# Patient Record
Sex: Male | Born: 1955 | Race: Black or African American | Hispanic: No | Marital: Single | Smoking: Current every day smoker
Health system: Southern US, Community
[De-identification: ages and names within clinical notes are randomized; demographics above are authoritative.]

---

## 2002-07-12 ENCOUNTER — Emergency Department (HOSPITAL_COMMUNITY): Admission: EM | Admit: 2002-07-12 | Discharge: 2002-07-12 | Payer: Self-pay | Admitting: Emergency Medicine

## 2004-05-31 ENCOUNTER — Emergency Department (HOSPITAL_COMMUNITY): Admission: EM | Admit: 2004-05-31 | Discharge: 2004-05-31 | Payer: Self-pay | Admitting: Emergency Medicine

## 2004-11-16 ENCOUNTER — Emergency Department (HOSPITAL_COMMUNITY): Admission: EM | Admit: 2004-11-16 | Discharge: 2004-11-16 | Payer: Self-pay | Admitting: Emergency Medicine

## 2004-12-03 ENCOUNTER — Emergency Department (HOSPITAL_COMMUNITY): Admission: EM | Admit: 2004-12-03 | Discharge: 2004-12-03 | Payer: Self-pay | Admitting: Emergency Medicine

## 2004-12-06 ENCOUNTER — Inpatient Hospital Stay (HOSPITAL_COMMUNITY): Admission: EM | Admit: 2004-12-06 | Discharge: 2004-12-08 | Payer: Self-pay | Admitting: Emergency Medicine

## 2004-12-09 ENCOUNTER — Emergency Department (HOSPITAL_COMMUNITY): Admission: EM | Admit: 2004-12-09 | Discharge: 2004-12-09 | Payer: Self-pay | Admitting: Emergency Medicine

## 2004-12-09 ENCOUNTER — Encounter (HOSPITAL_COMMUNITY): Admission: RE | Admit: 2004-12-09 | Discharge: 2005-03-09 | Payer: Self-pay | Admitting: Orthopaedic Surgery

## 2004-12-09 ENCOUNTER — Ambulatory Visit: Payer: Self-pay | Admitting: Family Medicine

## 2006-10-02 ENCOUNTER — Emergency Department (HOSPITAL_COMMUNITY): Admission: EM | Admit: 2006-10-02 | Discharge: 2006-10-02 | Payer: Self-pay | Admitting: Family Medicine

## 2007-04-10 ENCOUNTER — Emergency Department (HOSPITAL_COMMUNITY): Admission: EM | Admit: 2007-04-10 | Discharge: 2007-04-10 | Payer: Self-pay | Admitting: Emergency Medicine

## 2010-07-24 NOTE — Discharge Summary (Signed)
NAME:  David Young, David Young NO.:  [id number]   MEDICAL RECORD NO.:  [id number]          PATIENT TYPE:  INP   LOCATION:  1616                         FACILITY:  [facility]   PHYSICIAN:  Mark C. Ophelia Charter, M.D.    DATE OF BIRTH:  09/11/1955   DATE OF ADMISSION:  12/06/2004  DATE OF DISCHARGE:  12/08/2004                                 DISCHARGE SUMMARY   FINAL DIAGNOSES:  Right long finger dorsal laceration with abscess  formation.   A 55 year old male had a right finger dorsal laceration closed in the ER two  weeks ago.  Doing well until 48 hours prior to this presentation with pain,  edema, and fever.  No previous history of hospitalizations or surgery.  He  works at [employer].   ALLERGIES:  NKDA.   REVIEW OF SYSTEMS:  __neg.CV.Resp. Gi, Endo.________.   Temperature was 100.4.   ADMISSION LABS:  Routine admission labs included white count 12,600,  hemoglobin 14.8.  Chemistry panel was normal.  Glycosylated hemoglobin was  5.8 with a serum glucose of 95.  HIV was nonreactive.  C-reactive protein  was 2.3.  Vancomycin trough was obtained, which is 5.4 following the  hospitalization, and cultures obtained in the operating room showed moderate  Staphylococcus aureus sensitive to oxacillin.   HOSPITAL COURSE:  The patient was admitted, taken to the operating room, and  underwent irrigation and debridement of purulent material from his finger.  Cultures were obtained.  He was started on vancomycin and Cleocin.  Daily  dressing changes with pulsatile lavage, arm elevation.  He is able to  shower, placed on skin precautions.  Social Services care management  arrangements were made for doxycycline 100 mg p.o. b.i.d.  and to get his  prescription filled.  After his last dose of vancomycin, he is discharged on  October 3 and with culture results, he was switched to Keflex 500 mg p.o.  b.i.d. with outpatient hydrotherapy.  Also followup in one week will be in  my  office.  Range of motion of the finger is significantly improved.      Mark C. Ophelia Charter, M.D.  Electronically Signed     MCY/MEDQ  D:  01/08/2005  T:  01/08/2005  Job:  [id number]

## 2012-11-27 ENCOUNTER — Emergency Department (HOSPITAL_COMMUNITY): Payer: Self-pay

## 2012-11-27 ENCOUNTER — Encounter (HOSPITAL_COMMUNITY): Payer: Self-pay

## 2012-11-27 ENCOUNTER — Emergency Department (HOSPITAL_COMMUNITY)
Admission: EM | Admit: 2012-11-27 | Discharge: 2012-11-27 | Disposition: A | Discharge status: Home / Self Care | Payer: Self-pay | Attending: Emergency Medicine | Admitting: Emergency Medicine

## 2012-11-27 DIAGNOSIS — T07XXXA Unspecified multiple injuries, initial encounter: Secondary | ICD-10-CM

## 2012-11-27 DIAGNOSIS — F17200 Nicotine dependence, unspecified, uncomplicated: Secondary | ICD-10-CM | POA: Insufficient documentation

## 2012-11-27 DIAGNOSIS — Y92410 Unspecified street and highway as the place of occurrence of the external cause: Secondary | ICD-10-CM | POA: Insufficient documentation

## 2012-11-27 DIAGNOSIS — S93409A Sprain of unspecified ligament of unspecified ankle, initial encounter: Secondary | ICD-10-CM | POA: Insufficient documentation

## 2012-11-27 DIAGNOSIS — Y9355 Activity, bike riding: Secondary | ICD-10-CM | POA: Insufficient documentation

## 2012-11-27 DIAGNOSIS — IMO0002 Reserved for concepts with insufficient information to code with codable children: Secondary | ICD-10-CM | POA: Insufficient documentation

## 2012-11-27 MED ORDER — IBUPROFEN 800 MG PO TABS: 800.0000 mg | ORAL_TABLET | Freq: Three times a day (TID) | ORAL | Status: DC | PRN

## 2012-11-27 MED ORDER — HYDROCODONE-ACETAMINOPHEN 5-325 MG PO TABS
1.0000 | ORAL_TABLET | Freq: Once | ORAL | Status: AC
  Administered 2012-11-27: 1 via ORAL
  Filled 2012-11-27: qty 1

## 2012-11-27 MED ORDER — TETANUS-DIPHTH-ACELL PERTUSSIS 5-2.5-18.5 LF-MCG/0.5 IM SUSP
0.5000 mL | Freq: Once | INTRAMUSCULAR | Status: AC
  Administered 2012-11-27: 0.5 mL via INTRAMUSCULAR
  Filled 2012-11-27: qty 0.5
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
## 2012-11-28 NOTE — ED Provider Notes (Signed)
Medical screening examination/treatment/procedure(s) were performed by non-physician practitioner and as supervising physician I was immediately available for consultation/collaboration.  Courtney F Horton, MD 11/28/12 0009 

## 2015-06-20 ENCOUNTER — Emergency Department (HOSPITAL_COMMUNITY)
Admission: EM | Admit: 2015-06-20 | Discharge: 2015-06-20 | Disposition: A | Discharge status: Home / Self Care | Payer: No Typology Code available for payment source | Attending: Emergency Medicine | Admitting: Emergency Medicine

## 2015-06-20 ENCOUNTER — Encounter (HOSPITAL_COMMUNITY): Payer: Self-pay | Admitting: Emergency Medicine

## 2015-06-20 DIAGNOSIS — Y9389 Activity, other specified: Secondary | ICD-10-CM | POA: Insufficient documentation

## 2015-06-20 DIAGNOSIS — Y998 Other external cause status: Secondary | ICD-10-CM | POA: Insufficient documentation

## 2015-06-20 DIAGNOSIS — Y9289 Other specified places as the place of occurrence of the external cause: Secondary | ICD-10-CM | POA: Insufficient documentation

## 2015-06-20 DIAGNOSIS — X58XXXA Exposure to other specified factors, initial encounter: Secondary | ICD-10-CM | POA: Insufficient documentation

## 2015-06-20 DIAGNOSIS — F17210 Nicotine dependence, cigarettes, uncomplicated: Secondary | ICD-10-CM | POA: Insufficient documentation

## 2015-06-20 DIAGNOSIS — T7840XA Allergy, unspecified, initial encounter: Secondary | ICD-10-CM

## 2015-06-20 MED ORDER — DIPHENHYDRAMINE HCL 25 MG PO CAPS
25.0000 mg | ORAL_CAPSULE | Freq: Once | ORAL | Status: AC
  Administered 2015-06-20: 25 mg via ORAL
  Filled 2015-06-20: qty 1

## 2015-06-20 MED ORDER — PREDNISONE 20 MG PO TABS
60.0000 mg | ORAL_TABLET | Freq: Once | ORAL | Status: AC
  Administered 2015-06-20: 60 mg via ORAL
  Filled 2015-06-20: qty 3

## 2015-06-20 MED ORDER — PREDNISONE 10 MG PO TABS: 20.0000 mg | ORAL_TABLET | Freq: Every day | ORAL | Status: AC

## 2015-06-20 MED ORDER — CLINDAMYCIN HCL 150 MG PO CAPS: 150.0000 mg | ORAL_CAPSULE | Freq: Four times a day (QID) | ORAL | Status: AC

## 2015-06-20 NOTE — Discharge Instructions (Signed)

## 2015-06-20 NOTE — ED Notes (Signed)
Pt arrives with insect/spider bite to R cheek ongoing several days, swelling onset yesterday. States itching. No pain.

## 2015-06-20 NOTE — ED Provider Notes (Signed)
CSN: [id number]     Arrival date & time 06/20/15  [id number] History   First MD Initiated Contact with Patient 06/20/15 [id number]     Chief Complaint  Patient presents with  . Insect Bite     (Consider location/radiation/quality/duration/timing/severity/associated sxs/prior Treatment) HPI 60 y.o. Male complaining of right cheek itching, redness, and swelling.  States he got bitten by a spider Wednesday night -unwitnessed.  He states he had a spider in the past, but he did not see a spider this time.  He feels this was a spider.  Denies sore throat, difficulty swallowing, breathing, or vision change.  He denies discharge from wound.   History reviewed. No pertinent past medical history. History reviewed. No pertinent past surgical history. No family history on file. Social History  Substance Use Topics  . Smoking status: Current Every Day Smoker -- 1.00 packs/day    Types: Cigarettes  . Smokeless tobacco: None  . Alcohol Use: No    Review of Systems  All other systems reviewed and are negative.     Allergies  Review of patient's allergies indicates no known allergies.  Home Medications   Prior to Admission medications   Medication Sig Start Date End Date Taking? Authorizing Provider  clindamycin (CLEOCIN) 150 MG capsule Take 1 capsule (150 mg total) by mouth every 6 (six) hours. 06/20/15   Pattricia Boss, MD  predniSONE (DELTASONE) 10 MG tablet Take 2 tablets (20 mg total) by mouth daily. 06/20/15   Pattricia Boss, MD   BP 139/90 mmHg  Pulse 79  Temp(Src) 98.4 F (36.9 C) (Oral)  Resp 16  Ht 5' 10.5" (1.791 m)  Wt 87.998 kg  BMI 27.43 kg/m2  SpO2 98% Physical Exam  Constitutional: He is oriented to person, place, and time. He appears well-developed and well-nourished. No distress.  HENT:  Head: Normocephalic.    Right Ear: External ear normal.  Left Ear: External ear normal.  Nose: Nose normal.  Mouth/Throat: Oropharynx is clear and moist.  Excoriated area right cheek with  some surrounding erythema and firm center but no fluctuance or discharge.   Eyes: Conjunctivae and EOM are normal. Pupils are equal, round, and reactive to light.  Neck: Normal range of motion. Neck supple.  Cardiovascular: Normal rate.   Pulmonary/Chest: Effort normal.  Abdominal: Soft.  Musculoskeletal: Normal range of motion. He exhibits no edema.  Neurological: He is alert and oriented to person, place, and time.  Skin: Skin is warm and dry.  Nursing note and vitals reviewed.   ED Course  Procedures (including critical care time) Labs Review Labs Reviewed - No data to display  Imaging Review No results found. I have personally reviewed and evaluated these images and lab results as part of my medical decision-making.   EKG Interpretation None      MDM   Final diagnoses:  Allergic reaction, initial encounter        Pattricia Boss, MD 06/20/15 [phone]

## 2016-09-05 ENCOUNTER — Encounter (HOSPITAL_COMMUNITY): Payer: Self-pay | Admitting: *Deleted

## 2016-09-05 ENCOUNTER — Emergency Department (HOSPITAL_COMMUNITY)
Admission: EM | Admit: 2016-09-05 | Discharge: 2016-09-05 | Disposition: A | Discharge status: Home / Self Care | Payer: Non-veteran care | Attending: Emergency Medicine | Admitting: Emergency Medicine

## 2016-09-05 DIAGNOSIS — R791 Abnormal coagulation profile: Secondary | ICD-10-CM | POA: Insufficient documentation

## 2016-09-05 DIAGNOSIS — R200 Anesthesia of skin: Secondary | ICD-10-CM | POA: Diagnosis present

## 2016-09-05 DIAGNOSIS — F17210 Nicotine dependence, cigarettes, uncomplicated: Secondary | ICD-10-CM | POA: Diagnosis not present

## 2016-09-05 DIAGNOSIS — G5632 Lesion of radial nerve, left upper limb: Secondary | ICD-10-CM

## 2016-09-05 LAB — I-STAT TROPONIN, ED: Troponin i, poc: 0.02 ng/mL (ref 0.00–0.08)

## 2016-09-05 LAB — DIFFERENTIAL
Basophils Absolute: 0 10*3/uL (ref 0.0–0.1)
Basophils Relative: 0 %
EOS PCT: 1 %
Eosinophils Absolute: 0.1 10*3/uL (ref 0.0–0.7)
LYMPHS ABS: 1.9 10*3/uL (ref 0.7–4.0)
LYMPHS PCT: 22 %
MONO ABS: 0.6 10*3/uL (ref 0.1–1.0)
Monocytes Relative: 7 %
NEUTROS ABS: 6.2 10*3/uL (ref 1.7–7.7)
NEUTROS PCT: 70 %

## 2016-09-05 LAB — CBC
HCT: 41.1 % (ref 39.0–52.0)
HEMOGLOBIN: 14.1 g/dL (ref 13.0–17.0)
MCH: 35.2 pg — AB (ref 26.0–34.0)
MCHC: 34.3 g/dL (ref 30.0–36.0)
MCV: 102.5 fL — AB (ref 78.0–100.0)
PLATELETS: 188 10*3/uL (ref 150–400)
RBC: 4.01 MIL/uL — AB (ref 4.22–5.81)
RDW: 14.9 % (ref 11.5–15.5)
WBC: 8.9 10*3/uL (ref 4.0–10.5)

## 2016-09-05 LAB — I-STAT CHEM 8, ED
BUN: 9 mg/dL (ref 6–20)
CALCIUM ION: 1.05 mmol/L — AB (ref 1.15–1.40)
CHLORIDE: 105 mmol/L (ref 101–111)
CREATININE: 0.8 mg/dL (ref 0.61–1.24)
GLUCOSE: 142 mg/dL — AB (ref 65–99)
HCT: 42 % (ref 39.0–52.0)
Hemoglobin: 14.3 g/dL (ref 13.0–17.0)
Potassium: 3.6 mmol/L (ref 3.5–5.1)
Sodium: 140 mmol/L (ref 135–145)
TCO2: 23 mmol/L (ref 0–100)

## 2016-09-05 LAB — COMPREHENSIVE METABOLIC PANEL
ALBUMIN: 3.6 g/dL (ref 3.5–5.0)
ALK PHOS: 50 U/L (ref 38–126)
ALT: 22 U/L (ref 17–63)
AST: 33 U/L (ref 15–41)
Anion gap: 9 (ref 5–15)
BILIRUBIN TOTAL: 0.9 mg/dL (ref 0.3–1.2)
BUN: 7 mg/dL (ref 6–20)
CALCIUM: 8.6 mg/dL — AB (ref 8.9–10.3)
CO2: 22 mmol/L (ref 22–32)
Chloride: 106 mmol/L (ref 101–111)
Creatinine, Ser: 0.92 mg/dL (ref 0.61–1.24)
GFR calc Af Amer: >60 mL/min (ref >60)
GFR calc non Af Amer: >60 mL/min (ref >60)
GLUCOSE: 148 mg/dL — AB (ref 65–99)
Potassium: 3.6 mmol/L (ref 3.5–5.1)
SODIUM: 137 mmol/L (ref 135–145)
TOTAL PROTEIN: 6.5 g/dL (ref 6.5–8.1)

## 2016-09-05 LAB — APTT: aPTT: 33 seconds (ref 24–36)

## 2016-09-05 NOTE — ED Notes (Signed)
Declined W/C at D/C and was escorted to lobby by RN. 

## 2016-09-05 NOTE — ED Triage Notes (Signed)
Pt reports falling asleep on the porch last night and woke up with left arm weakness and difficulty gripping objects. Denies numbness in his leg. Denies difficulty swallowing this am. Airway intact.
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
## 2016-09-05 NOTE — Discharge Instructions (Signed)
You were seen in the ED today with a nerve palsy. This will resolve but may take days to weeks. Follow up with the Neurology group listed, or another neurologist if you prefer, in 2 weeks.  Return to the ED with and worsening weakness, numbness, leg weakness/numbness, speech changes, or difficulty swallowing.

## 2016-10-15 ENCOUNTER — Emergency Department (HOSPITAL_COMMUNITY)
Admission: EM | Admit: 2016-10-15 | Discharge: 2016-10-15 | Disposition: A | Discharge status: Home / Self Care | Payer: Non-veteran care

## 2016-10-15 NOTE — ED Notes (Signed)
Called for triage x1, no answer 

## 2017-05-11 ENCOUNTER — Emergency Department (HOSPITAL_COMMUNITY): Payer: Non-veteran care

## 2017-05-11 ENCOUNTER — Encounter (HOSPITAL_COMMUNITY): Payer: Self-pay

## 2017-05-11 ENCOUNTER — Other Ambulatory Visit: Payer: Self-pay

## 2017-05-11 DIAGNOSIS — R079 Chest pain, unspecified: Secondary | ICD-10-CM | POA: Diagnosis not present

## 2017-05-11 DIAGNOSIS — F17210 Nicotine dependence, cigarettes, uncomplicated: Secondary | ICD-10-CM | POA: Diagnosis not present

## 2017-05-11 LAB — BASIC METABOLIC PANEL
ANION GAP: 9 (ref 5–15)
BUN: 9 mg/dL (ref 6–20)
CALCIUM: 8.7 mg/dL — AB (ref 8.9–10.3)
CO2: 27 mmol/L (ref 22–32)
Chloride: 103 mmol/L (ref 101–111)
Creatinine, Ser: 0.97 mg/dL (ref 0.61–1.24)
GFR calc Af Amer: >60 mL/min (ref >60)
Glucose, Bld: 102 mg/dL — ABNORMAL HIGH (ref 65–99)
POTASSIUM: 3.6 mmol/L (ref 3.5–5.1)
SODIUM: 139 mmol/L (ref 135–145)

## 2017-05-11 LAB — CBC
HEMATOCRIT: 42.3 % (ref 39.0–52.0)
Hemoglobin: 14.6 g/dL (ref 13.0–17.0)
MCH: 34.7 pg — ABNORMAL HIGH (ref 26.0–34.0)
MCHC: 34.5 g/dL (ref 30.0–36.0)
MCV: 100.5 fL — ABNORMAL HIGH (ref 78.0–100.0)
Platelets: 235 10*3/uL (ref 150–400)
RBC: 4.21 MIL/uL — ABNORMAL LOW (ref 4.22–5.81)
RDW: 14.5 % (ref 11.5–15.5)
WBC: 9.1 10*3/uL (ref 4.0–10.5)

## 2017-05-11 LAB — I-STAT TROPONIN, ED: Troponin i, poc: 0.02 ng/mL (ref 0.00–0.08)

## 2017-05-11 NOTE — ED Triage Notes (Signed)
Pt endorses intermittent generalized chest pain "for a long time" Pt has never been to the hospital to be evaluated for cp. Denies any other sx. VSS.

## 2017-05-12 ENCOUNTER — Emergency Department (HOSPITAL_COMMUNITY)
Admission: EM | Admit: 2017-05-12 | Discharge: 2017-05-12 | Disposition: A | Discharge status: Home / Self Care | Payer: Non-veteran care | Attending: Emergency Medicine | Admitting: Emergency Medicine

## 2017-05-12 DIAGNOSIS — R079 Chest pain, unspecified: Secondary | ICD-10-CM

## 2017-05-12 LAB — I-STAT TROPONIN, ED: Troponin i, poc: 0 ng/mL (ref 0.00–0.08)

## 2017-05-12 NOTE — ED Provider Notes (Signed)
[facility] EMERGENCY DEPARTMENT Provider Note   CSN: [id number] Arrival date & time: 05/11/17  1752     History   Chief Complaint Chief Complaint  Patient presents with  . Chest Pain    HPI David Young is a 62 y.o. male.   62 year old male with no significant past medical history presents to the emergency department for evaluation of chest pain.  Patient states that chest pain began while he was at work.  He notes that he was at rest, but denies any specific worsening of his pain with exertion.  He states that his pain is located in the center of his chest.  It sometimes radiates to his back.  He describes the pain as squeezing.  It has been coming and going since onset today, though he has had similar pain which has been intermittent over the past year.  He denies shortness of breath, specifically, but states that his lungs feel "clogged up".  He has not had any nausea, vomiting, lightheadedness, diaphoresis, abdominal pain, leg swelling associated with his symptoms.  He has had a slight cough recently which he notes worsens his chest pain.  No fevers or known hx of DM, HTN, HLD. He is a 0.5ppd smoker.  The patient is not currently followed by a PCP.      History reviewed. No pertinent past medical history.  There are no active problems to display for this patient.   History reviewed. No pertinent surgical history.     Home Medications    Prior to Admission medications   Medication Sig Start Date End Date Taking? Authorizing Provider  clindamycin (CLEOCIN) 150 MG capsule Take 1 capsule (150 mg total) by mouth every 6 (six) hours. Patient not taking: Reported on 05/12/2017 06/20/15   Pattricia Boss, MD  predniSONE (DELTASONE) 10 MG tablet Take 2 tablets (20 mg total) by mouth daily. Patient not taking: Reported on 05/12/2017 06/20/15   Pattricia Boss, MD    Family History History reviewed. No pertinent family history.  Social History Social History    Tobacco Use  . Smoking status: Current Every Day Smoker    Packs/day: 1.00    Types: Cigarettes  Substance Use Topics  . Alcohol use: Yes    Comment: 1-2 six packs per day  . Drug use: Yes    Types: Cocaine    Comment: occ cocaine     Allergies   Patient has no known allergies.   Review of Systems Review of Systems Ten systems reviewed and are negative for acute change, except as noted in the HPI.    Physical Exam Updated Vital Signs BP 111/77   Pulse 69   Temp 98 F (36.7 C) (Oral)   Resp (!) 24   Ht 5\' 10"  (1.778 m)   Wt 79.4 kg (175 lb)   SpO2 96%   BMI 25.11 kg/m   Physical Exam  Constitutional: He is oriented to person, place, and time. He appears well-developed and well-nourished. No distress.  Patient resting comfortably, in NAD  HENT:  Head: Normocephalic and atraumatic.  Eyes: Conjunctivae and EOM are normal. No scleral icterus.  Neck: Normal range of motion.  No JVD  Cardiovascular: Normal rate, regular rhythm and intact distal pulses.  Pulmonary/Chest: Effort normal. No stridor. No respiratory distress. He has no wheezes. He has no rales.  Lungs CTAB  Musculoskeletal: Normal range of motion.  No BLE edema  Neurological: He is alert and oriented to person, place, and time.  He exhibits normal muscle tone. Coordination normal.  Skin: Skin is warm and dry. No rash noted. He is not diaphoretic. No erythema. No pallor.  Psychiatric: He has a normal mood and affect. His behavior is normal.  Nursing note and vitals reviewed.    ED Treatments / Results  Labs (all labs ordered are listed, but only abnormal results are displayed) Labs Reviewed  BASIC METABOLIC PANEL - Abnormal; Notable for the following components:      Result Value   Glucose, Bld 102 (*)    Calcium 8.7 (*)    All other components within normal limits  CBC - Abnormal; Notable for the following components:   RBC 4.21 (*)    MCV 100.5 (*)    MCH 34.7 (*)    All other components  within normal limits  I-STAT TROPONIN, ED  I-STAT TROPONIN, ED    EKG  EKG Interpretation  Date/Time:  Wednesday May 11 2017 17:57:20 EST Ventricular Rate:  83 PR Interval:  168 QRS Duration: 82 QT Interval:  366 QTC Calculation: 430 R Axis:   82 Text Interpretation:  Normal sinus rhythm Normal ECG Confirmed by Veryl Speak [phone]) on 05/12/2017 2:38:55 AM       Radiology Dg Chest 2 View  Result Date: 05/11/2017 CLINICAL DATA:  Generalized chest pain with shortness of breath EXAM: CHEST - 2 VIEW COMPARISON:  12/06/2004 FINDINGS: The heart size and mediastinal contours are within normal limits. Both lungs are clear. Mild scoliosis. IMPRESSION: No active cardiopulmonary disease. Electronically Signed   By: Donavan Foil M.D.   On: 05/11/2017 18:55    Procedures Procedures (including critical care time)  Medications Ordered in ED Medications - No data to display   Initial Impression / Assessment and Plan / ED Course  I have reviewed the triage vital signs and the nursing notes.  Pertinent labs & imaging results that were available during my care of the patient were reviewed by me and considered in my medical decision making (see chart for details).     61 year old male presents to the emergency department for evaluation of chest pain.  He reports onset of chest pain 1 year ago.  Symptoms have been intermittent.  He appreciated worsening pain today.  Patient resting comfortably on initial and repeat assessment, sleeping.  He has had stable vital signs since arrival.  No fevers.  No associated diaphoresis, nausea, vomiting, extremity numbness or weakness.  Lungs clear to auscultation bilaterally without associated hypoxia.  Cardiac workup was initiated in the emergency department.  Given age and risk factors with presenting history, heart score today is 3 consistent with low risk of acute coronary event.  This is further reassured by negative troponin x2.  No signs of acute  ischemia on EKG.  Chest x-ray shows no evidence of cardiopulmonary abnormality.  No mediastinal widening to suggest dissection.  Pulmonary embolus considered, but thought less likely given chronicity and absence of tachycardia, tachypnea, dyspnea, hypoxia.  Plan for continued outpatient evaluation by cardiology.  Referral provided.  Have advised the patient have a stress test completed in the next 30 days.  Smoking cessation suggested.  He has been instructed to return to the emergency department for new or concerning symptoms.  Return precautions discussed and provided. Patient discharged in stable condition with no unaddressed concerns.  Vitals:   05/12/17 0130 05/12/17 0200 05/12/17 0230 05/12/17 0300  BP: (!) 124/91 123/77 122/85 111/77  Pulse:  63 64 69  Resp:  20 19 (!) 24  Temp:      TempSrc:      SpO2:  100% 96% 96%  Weight:      Height:        Final Clinical Impressions(s) / ED Diagnoses   Final diagnoses:  Nonspecific chest pain    ED Discharge Orders    None       Antonietta Breach, PA-C 05/12/17 0350    Veryl Speak, MD 05/12/17 [phone]

## 2017-05-12 NOTE — Discharge Instructions (Signed)
We advise that you discontinue smoking.  Your cardiac workup today was reassuring and did not reveal a concerning cause of your chest pain today.  You have been provided a referral to cardiology for follow-up.  We advise that you have a stress test completed within the next 30 days.  This can be completed by a cardiologist.  If you have a primary care doctor, follow-up with them as soon as you are able for reassessment.  You may return to the emergency department for new or concerning symptoms.

## 2017-07-05 ENCOUNTER — Emergency Department (HOSPITAL_COMMUNITY)
Admission: EM | Admit: 2017-07-05 | Discharge: 2017-07-05 | Disposition: A | Discharge status: Home / Self Care | Payer: Non-veteran care | Attending: Emergency Medicine | Admitting: Emergency Medicine

## 2017-07-05 ENCOUNTER — Encounter (HOSPITAL_COMMUNITY): Payer: Self-pay

## 2017-07-05 DIAGNOSIS — F1010 Alcohol abuse, uncomplicated: Secondary | ICD-10-CM | POA: Diagnosis not present

## 2017-07-05 DIAGNOSIS — F17210 Nicotine dependence, cigarettes, uncomplicated: Secondary | ICD-10-CM | POA: Diagnosis not present

## 2017-07-05 MED ORDER — CHLORDIAZEPOXIDE HCL 25 MG PO CAPS: 25.0000 mg | ORAL_CAPSULE | Freq: Three times a day (TID) | ORAL | 0 refills | Status: AC | PRN

## 2017-07-05 NOTE — ED Notes (Signed)
ED Provider at bedside. 

## 2017-07-05 NOTE — ED Notes (Signed)
Bed: [facility] Expected date:  Expected time:  Means of arrival:  Comments: 

## 2017-07-05 NOTE — Discharge Instructions (Addendum)
It was our pleasure to provide your ER care today - we hope that you feel better.  Avoid alcohol use - follow up with AA, and use resource guide provided for additional community resources.  You may take librium as prescribed, as need, if withdrawal symptoms.   Do not drive if/when drinking alcohol, or if taking librium.  Follow up with primary care doctor in the coming week.  Return to ER if worse, new symptoms, trouble breathing, or other concern.

## 2017-07-05 NOTE — ED Triage Notes (Signed)
Pt is requesting detox from alcohol

## 2017-07-05 NOTE — ED Provider Notes (Signed)
[facility] [facility] DEPT Provider Note   CSN: [id number] Arrival date & time: 07/05/17  0549     History   Chief Complaint Chief Complaint  Patient presents with  . detox    HPI David Young is a 62 y.o. male.  Patient with hx etoh abuse, presents stating he is thinking about getting into a rehab or detox program. States drinks daily, cant quantify amount. States when stops drinking will feel shaky, but denies hx seizures, dts or complicated etoh withdrawal. Denies depression or thoughts of self harm. Normal appetite, but poor po intake of food in past day. Denies vomiting or diarrhea. No abd pain. Denies recent physical symptoms or injury. No wt loss. No headaches. No cp or sob. No recent trauma or fall. Denies neck or back pain.   The history is provided by the patient.    History reviewed. No pertinent past medical history.  There are no active problems to display for this patient.   History reviewed. No pertinent surgical history.      Home Medications    Prior to Admission medications   Medication Sig Start Date End Date Taking? Authorizing Provider  clindamycin (CLEOCIN) 150 MG capsule Take 1 capsule (150 mg total) by mouth every 6 (six) hours. Patient not taking: Reported on 05/12/2017 06/20/15   Pattricia Boss, MD  predniSONE (DELTASONE) 10 MG tablet Take 2 tablets (20 mg total) by mouth daily. Patient not taking: Reported on 05/12/2017 06/20/15   Pattricia Boss, MD    Family History History reviewed. No pertinent family history.  Social History Social History   Tobacco Use  . Smoking status: Current Every Day Smoker    Packs/day: 1.00    Types: Cigarettes  . Smokeless tobacco: Never Used  Substance Use Topics  . Alcohol use: Yes    Comment: 1-2 six packs per day  . Drug use: Yes    Types: Cocaine    Comment: occ cocaine     Allergies   Patient has no known allergies.   Review of Systems Review of Systems  Constitutional:  Negative for fever.  HENT: Negative for sore throat.   Eyes: Negative for visual disturbance.  Respiratory: Negative for cough and shortness of breath.   Cardiovascular: Negative for chest pain.  Gastrointestinal: Negative for abdominal pain and vomiting.  Genitourinary: Negative for flank pain.  Musculoskeletal: Negative for neck pain.  Skin: Negative for rash.  Neurological: Negative for headaches.  Hematological: Does not bruise/bleed easily.  Psychiatric/Behavioral: Negative for confusion.     Physical Exam Updated Vital Signs BP (!) 151/86 (BP Location: Left Arm)   Pulse 76   Temp 98.2 F (36.8 C) (Oral)   Resp 16   SpO2 97%   Physical Exam  Constitutional: He is oriented to person, place, and time. He appears well-developed and well-nourished. No distress.  HENT:  Head: Atraumatic.  Mouth/Throat: Oropharynx is clear and moist.  Eyes: Conjunctivae are normal.  Neck: Neck supple. No tracheal deviation present.  Cardiovascular: Normal rate, regular rhythm, normal heart sounds and intact distal pulses.  Pulmonary/Chest: Effort normal and breath sounds normal. No accessory muscle usage. No respiratory distress.  Abdominal: He exhibits no distension. There is no tenderness.  Musculoskeletal: He exhibits no edema.  Neurological: He is alert and oriented to person, place, and time.  Speech clear/fluent. Ambulates w steady gait. No tremor or shakes.   Skin: Skin is warm and dry. No rash noted. He is not diaphoretic.  Psychiatric: He  has a normal mood and affect.  Nursing note and vitals reviewed.    ED Treatments / Results  Labs (all labs ordered are listed, but only abnormal results are displayed) Labs Reviewed - No data to display  EKG None  Radiology No results found.  Procedures Procedures (including critical care time)  Medications Ordered in ED Medications - No data to display   Initial Impression / Assessment and Plan / ED Course  I have reviewed the  triage vital signs and the nursing notes.  Pertinent labs & imaging results that were available during my care of the patient were reviewed by me and considered in my medical decision making (see chart for details).  Po fluids. Snack.  Reviewed nursing notes and prior charts for additional history.   No current tremor or shakes. Ambulates w steady gait.   Normal mood and affect.   Will provide rx librium, and resource guide for community rehab options.  Also recommend primary care outpatient f/u.   Patient currently appears stable for d/c.     Final Clinical Impressions(s) / ED Diagnoses   Final diagnoses:  None    ED Discharge Orders    None       Lajean Saver, MD 07/05/17 [phone]

## 2017-09-12 ENCOUNTER — Other Ambulatory Visit: Payer: Self-pay

## 2017-09-12 ENCOUNTER — Emergency Department (HOSPITAL_COMMUNITY)
Admission: EM | Admit: 2017-09-12 | Discharge: 2017-09-12 | Disposition: A | Discharge status: Home / Self Care | Payer: Non-veteran care | Attending: Emergency Medicine | Admitting: Emergency Medicine

## 2017-09-12 ENCOUNTER — Encounter (HOSPITAL_COMMUNITY): Payer: Self-pay | Admitting: Emergency Medicine

## 2017-09-12 DIAGNOSIS — R600 Localized edema: Secondary | ICD-10-CM | POA: Diagnosis present

## 2017-09-12 DIAGNOSIS — F17210 Nicotine dependence, cigarettes, uncomplicated: Secondary | ICD-10-CM | POA: Diagnosis not present

## 2017-09-12 DIAGNOSIS — K047 Periapical abscess without sinus: Secondary | ICD-10-CM | POA: Diagnosis not present

## 2017-09-12 MED ORDER — AMOXICILLIN-POT CLAVULANATE 875-125 MG PO TABS: 1.0000 | ORAL_TABLET | Freq: Two times a day (BID) | ORAL | 0 refills | Status: AC

## 2017-09-12 MED ORDER — AMOXICILLIN-POT CLAVULANATE 875-125 MG PO TABS
1.0000 | ORAL_TABLET | Freq: Once | ORAL | Status: AC
  Administered 2017-09-12: 1 via ORAL
  Filled 2017-09-12: qty 1

## 2017-09-12 NOTE — ED Provider Notes (Signed)
[name] EMERGENCY DEPARTMENT Provider Note   CSN: [id number] Arrival date & time: 09/12/17  1416     History   Chief Complaint Chief Complaint  Patient presents with  . Insect Bite    HPI David Young is a 62 y.o. male.  HPI   62 year old male presents today with complaints of upper lip and dental swelling. Patient notes 3 day history of worsening swelling above the right upper incisor. Patient denies any discharge, denies any fever or chills, denies any redness of the face or trauma. Patient questions if he was bitten by an insect. No medications prior to arrival.  History reviewed. No pertinent past medical history.  There are no active problems to display for this patient.   History reviewed. No pertinent surgical history.      Home Medications    Prior to Admission medications   Medication Sig Start Date End Date Taking? Authorizing Provider  amoxicillin-clavulanate (AUGMENTIN) 875-125 MG tablet Take 1 tablet by mouth every 12 (twelve) hours. 09/12/17   Istvan Behar, Dellis Filbert, PA-C  chlordiazePOXIDE (LIBRIUM) 25 MG capsule Take 1 capsule (25 mg total) by mouth 3 (three) times daily as needed for withdrawal. 07/05/17   Lajean Saver, MD  clindamycin (CLEOCIN) 150 MG capsule Take 1 capsule (150 mg total) by mouth every 6 (six) hours. Patient not taking: Reported on 05/12/2017 06/20/15   Pattricia Boss, MD  predniSONE (DELTASONE) 10 MG tablet Take 2 tablets (20 mg total) by mouth daily. Patient not taking: Reported on 05/12/2017 06/20/15   Pattricia Boss, MD    Family History No family history on file.  Social History Social History   Tobacco Use  . Smoking status: Current Every Day Smoker    Packs/day: 1.00    Types: Cigarettes  . Smokeless tobacco: Never Used  Substance Use Topics  . Alcohol use: Yes    Comment: 1-2 six packs per day  . Drug use: Yes    Types: Cocaine    Comment: occ cocaine     Allergies   Patient has no known  allergies.   Review of Systems Review of Systems  All other systems reviewed and are negative.    Physical Exam Updated Vital Signs BP (!) 148/105 (BP Location: Right Arm)   Pulse 84   Temp 98.9 F (37.2 C) (Oral)   Resp 16   Ht 6' (1.829 m)   Wt 81.6 kg (180 lb)   SpO2 99%   BMI 24.41 kg/m   Physical Exam  Constitutional: He is oriented to person, place, and time. He appears well-developed and well-nourished.  HENT:  Head: Normocephalic and atraumatic.  Poor dentition, right gumline above the right incisor with induration and fluctuance- no overlying cellulitis with  Eyes: Pupils are equal, round, and reactive to light. Conjunctivae are normal. Right eye exhibits no discharge. Left eye exhibits no discharge. No scleral icterus.  Neck: Normal range of motion. No JVD present. No tracheal deviation present.  Pulmonary/Chest: Effort normal. No stridor.  Neurological: He is alert and oriented to person, place, and time. Coordination normal.  Psychiatric: He has a normal mood and affect. His behavior is normal. Judgment and thought content normal.  Nursing note and vitals reviewed.   ED Treatments / Results  Labs (all labs ordered are listed, but only abnormal results are displayed) Labs Reviewed - No data to display  EKG None  Radiology No results found.  Procedures .Marland KitchenIncision and Drainage Date/Time: 09/12/2017 7:00 PM Performed by: Okey Regal,  PA-C Authorized by: Okey Regal, PA-C   Consent:    Consent obtained:  Verbal   Consent given by:  Patient   Risks discussed:  Bleeding, incomplete drainage, infection and pain   Alternatives discussed:  No treatment, alternative treatment, delayed treatment and observation Location:    Type:  Abscess   Size:  1   Location: dental right anterior  Anesthesia (see MAR for exact dosages):    Anesthesia method:  Local infiltration   Local anesthetic:  Bupivacaine 0.5% WITH epi Procedure type:    Complexity:   Simple Procedure details:    Needle aspiration: no     Incision types:  Stab incision   Scalpel blade:  11   Wound management:  Probed and deloculated   Drainage:  Purulent   Drainage amount:  Moderate   Wound treatment:  Wound left open Post-procedure details:    Patient tolerance of procedure:  Tolerated well, no immediate complications   (including critical care time)  Medications Ordered in ED Medications  amoxicillin-clavulanate (AUGMENTIN) 875-125 MG per tablet 1 tablet (1 tablet Oral Given 09/12/17 1729)     Initial Impression / Assessment and Plan / ED Course  I have reviewed the triage vital signs and the nursing notes.  Pertinent labs & imaging results that were available during my care of the patient were reviewed by me and considered in my medical decision making (see chart for details).     Labs:   Imaging:  Consults:  Therapeutics:  Discharge Meds: Augmentin  Assessment/Plan:  62 year old male presents today with dental abscess. Drained at bedside. Placed on antibiotics. Patient will follow up as an outpatient, strict return precautions given. He verbalized understanding and agreement to today's plan.      Final Clinical Impressions(s) / ED Diagnoses   Final diagnoses:  Dental infection    ED Discharge Orders        Ordered    amoxicillin-clavulanate (AUGMENTIN) 875-125 MG tablet  Every 12 hours     09/12/17 1712       Okey Regal, PA-C 09/12/17 1903    Fredia Sorrow, MD 09/14/17 [phone]

## 2017-09-12 NOTE — Discharge Instructions (Signed)
Please read attached information. If you experience any new or worsening signs or symptoms please return to the emergency room for evaluation. Please follow-up with your primary care provider or specialist as discussed. Please use medication prescribed only as directed and discontinue taking if you have any concerning signs or symptoms.   °

## 2017-09-12 NOTE — ED Triage Notes (Signed)
Pt. Stated, I got bit by a spider on my top lip and some of my face is swollen.

## 2018-03-15 ENCOUNTER — Other Ambulatory Visit: Payer: Self-pay

## 2018-03-15 ENCOUNTER — Emergency Department (HOSPITAL_COMMUNITY): Payer: Non-veteran care

## 2018-03-15 ENCOUNTER — Encounter (HOSPITAL_COMMUNITY): Payer: Self-pay | Admitting: Emergency Medicine

## 2018-03-15 ENCOUNTER — Emergency Department (HOSPITAL_COMMUNITY)
Admission: EM | Admit: 2018-03-15 | Discharge: 2018-03-15 | Disposition: A | Discharge status: Home / Self Care | Payer: Non-veteran care | Attending: Emergency Medicine | Admitting: Emergency Medicine

## 2018-03-15 DIAGNOSIS — R42 Dizziness and giddiness: Secondary | ICD-10-CM | POA: Diagnosis not present

## 2018-03-15 DIAGNOSIS — F17210 Nicotine dependence, cigarettes, uncomplicated: Secondary | ICD-10-CM | POA: Insufficient documentation

## 2018-03-15 DIAGNOSIS — F10920 Alcohol use, unspecified with intoxication, uncomplicated: Secondary | ICD-10-CM | POA: Diagnosis not present

## 2018-03-15 DIAGNOSIS — Z79899 Other long term (current) drug therapy: Secondary | ICD-10-CM | POA: Insufficient documentation

## 2018-03-15 DIAGNOSIS — R52 Pain, unspecified: Secondary | ICD-10-CM

## 2018-03-15 DIAGNOSIS — M25521 Pain in right elbow: Secondary | ICD-10-CM | POA: Diagnosis present

## 2018-03-15 DIAGNOSIS — Y907 Blood alcohol level of 200-239 mg/100 ml: Secondary | ICD-10-CM | POA: Diagnosis not present

## 2018-03-15 LAB — CBC WITH DIFFERENTIAL/PLATELET
ABS IMMATURE GRANULOCYTES: 0.03 10*3/uL (ref 0.00–0.07)
BASOS ABS: 0 10*3/uL (ref 0.0–0.1)
Basophils Relative: 1 %
EOS ABS: 0.1 10*3/uL (ref 0.0–0.5)
Eosinophils Relative: 2 %
HEMATOCRIT: 45 % (ref 39.0–52.0)
Hemoglobin: 14.6 g/dL (ref 13.0–17.0)
IMMATURE GRANULOCYTES: 1 %
LYMPHS ABS: 2.3 10*3/uL (ref 0.7–4.0)
Lymphocytes Relative: 35 %
MCH: 33.6 pg (ref 26.0–34.0)
MCHC: 32.4 g/dL (ref 30.0–36.0)
MCV: 103.4 fL — ABNORMAL HIGH (ref 80.0–100.0)
Monocytes Absolute: 0.9 10*3/uL (ref 0.1–1.0)
Monocytes Relative: 15 %
NEUTROS ABS: 3.1 10*3/uL (ref 1.7–7.7)
NEUTROS PCT: 46 %
NRBC: 0 % (ref 0.0–0.2)
PLATELETS: 196 10*3/uL (ref 150–400)
RBC: 4.35 MIL/uL (ref 4.22–5.81)
RDW: 16.8 % — AB (ref 11.5–15.5)
WBC: 6.5 10*3/uL (ref 4.0–10.5)

## 2018-03-15 LAB — AMMONIA: Ammonia: 47 umol/L — ABNORMAL HIGH (ref 9–35)

## 2018-03-15 LAB — BASIC METABOLIC PANEL
ANION GAP: 11 (ref 5–15)
BUN: 12 mg/dL (ref 8–23)
CALCIUM: 8.4 mg/dL — AB (ref 8.9–10.3)
CHLORIDE: 108 mmol/L (ref 98–111)
CO2: 22 mmol/L (ref 22–32)
Creatinine, Ser: 0.85 mg/dL (ref 0.61–1.24)
GFR calc non Af Amer: >60 mL/min (ref >60)
Glucose, Bld: 92 mg/dL (ref 70–99)
POTASSIUM: 3.9 mmol/L (ref 3.5–5.1)
Sodium: 141 mmol/L (ref 135–145)

## 2018-03-15 LAB — ETHANOL: Alcohol, Ethyl (B): 229 mg/dL — ABNORMAL HIGH (ref <10)

## 2018-03-15 LAB — URINALYSIS, COMPLETE (UACMP) WITH MICROSCOPIC
BILIRUBIN URINE: NEGATIVE
Glucose, UA: NEGATIVE mg/dL
Hgb urine dipstick: NEGATIVE
Ketones, ur: NEGATIVE mg/dL
Leukocytes, UA: NEGATIVE
NITRITE: NEGATIVE
Protein, ur: NEGATIVE mg/dL
Specific Gravity, Urine: 1.005 (ref 1.005–1.030)
pH: 5 (ref 5.0–8.0)

## 2018-03-15 LAB — RAPID URINE DRUG SCREEN, HOSP PERFORMED
Amphetamines: NOT DETECTED
BARBITURATES: NOT DETECTED
Benzodiazepines: NOT DETECTED
Cocaine: NOT DETECTED
Opiates: NOT DETECTED
Tetrahydrocannabinol: POSITIVE — AB

## 2018-03-15 LAB — CBG MONITORING, ED: Glucose-Capillary: 84 mg/dL (ref 70–99)

## 2018-03-15 NOTE — ED Provider Notes (Signed)
[facility] DEPT Provider Note   CSN: [id number] Arrival date & time: 03/15/18  0010     History   Chief Complaint Chief Complaint  Patient presents with  . Generalized Pain    HPI David Young is a 63 y.o. male.  Who presents the emergency department chief complaint of elbow pain.  Is a level 5 caveat due to altered mental status and suspected alcohol intoxication.  Patient does admit to drinking alcohol tonight.  He has been belligerent.  Complains of pain all over states that he got dizzy and fell.  He gives no other history except that his right elbow hurts.  Patient seems to have some wheezing and productive cough but is unable to tell me more about it.  I am unsure of the time of fall.  HPI  History reviewed. No pertinent past medical history.  There are no active problems to display for this patient.   History reviewed. No pertinent surgical history.      Home Medications    Prior to Admission medications   Medication Sig Start Date End Date Taking? Authorizing Provider  amoxicillin-clavulanate (AUGMENTIN) 875-125 MG tablet Take 1 tablet by mouth every 12 (twelve) hours. 09/12/17   Hedges, Dellis Filbert, PA-C  chlordiazePOXIDE (LIBRIUM) 25 MG capsule Take 1 capsule (25 mg total) by mouth 3 (three) times daily as needed for withdrawal. 07/05/17   Lajean Saver, MD  clindamycin (CLEOCIN) 150 MG capsule Take 1 capsule (150 mg total) by mouth every 6 (six) hours. Patient not taking: Reported on 05/12/2017 06/20/15   Pattricia Boss, MD  predniSONE (DELTASONE) 10 MG tablet Take 2 tablets (20 mg total) by mouth daily. Patient not taking: Reported on 05/12/2017 06/20/15   Pattricia Boss, MD    Family History History reviewed. No pertinent family history.  Social History Social History   Tobacco Use  . Smoking status: Current Every Day Smoker    Packs/day: 1.00    Types: Cigarettes  . Smokeless tobacco: Never Used  Substance Use Topics  . Alcohol  use: Yes    Comment: 1-2 six packs per day  . Drug use: Yes    Types: Cocaine    Comment: occ cocaine     Allergies   Patient has no known allergies.   Review of Systems Review of Systems Unable to review systems due to alcohol intoxication   Physical Exam Updated Vital Signs BP 127/82 (BP Location: Right Arm)   Pulse 72   Temp (!) 97.3 F (36.3 C) (Oral)   Resp 19   SpO2 97%   Physical Exam Vitals signs and nursing note reviewed.  Constitutional:      General: He is not in acute distress.    Appearance: He is well-developed. He is not diaphoretic.     Comments: Somnolent and intoxicated  HENT:     Head: Normocephalic and atraumatic.  Eyes:     General: No scleral icterus.    Conjunctiva/sclera: Conjunctivae normal.  Neck:     Musculoskeletal: Normal range of motion and neck supple.  Cardiovascular:     Rate and Rhythm: Normal rate and regular rhythm.     Heart sounds: Normal heart sounds.  Pulmonary:     Effort: Pulmonary effort is normal. No respiratory distress.     Breath sounds: Normal breath sounds.  Abdominal:     Palpations: Abdomen is soft.     Tenderness: There is no abdominal tenderness.  Musculoskeletal:     Comments: Complains of  right elbow pain No deformity or abrasions.  Tender to palpation however when distracted patient uses the elbow and moves it easily.  Normal radial pulse  Skin:    General: Skin is warm and dry.  Psychiatric:        Behavior: Behavior normal.      ED Treatments / Results  Labs (all labs ordered are listed, but only abnormal results are displayed) Labs Reviewed  BASIC METABOLIC PANEL  CBC WITH DIFFERENTIAL/PLATELET    EKG None  Radiology No results found.  Procedures Procedures (including critical care time)  Medications Ordered in ED Medications - No data to display   Initial Impression / Assessment and Plan / ED Course  I have reviewed the triage vital signs and the nursing notes.  Pertinent  labs & imaging results that were available during my care of the patient were reviewed by me and considered in my medical decision making (see chart for details).  Clinical Course as of Mar 15 653  Wed Mar 15, 2018  2426 Basic metabolic panel(!) [AH]    Clinical Course User Index [AH] Margarita Mail, PA-C    Patient with some oxygen desaturation during snoring sleep suggestive of apnea.  Given nasal cannula patient's alcohol level on labs at 229 and currently clinically intoxicated.  I reviewed the patient's UDS which shows THC, mildly elevated ammonia level, urine without significant abnormalities, CBC shows macrocytosis consistent with alcohol abuse.  CT head chest x-ray and elbow x-ray reviewed by me without significant abnormalities and I agree with radiologic interpretation.  Patient awaiting clinical sobriety.  I have given sign out to PA [facility]  Final Clinical Impressions(s) / ED Diagnoses   Final diagnoses:  Alcoholic intoxication without complication [facility])    ED Discharge Orders    None       Margarita Mail, PA-C 03/15/18 0657    Shanon Rosser, MD 03/15/18 [phone]

## 2018-03-15 NOTE — Discharge Instructions (Signed)
Get help right away if: °You have any of the following: °Moderate to severe trouble with coordination, speech, memory, or attention. °Trouble staying awake. °Severe confusion. °A seizure. °Light-headedness. °Fainting. °Vomiting bright red blood or material that looks like coffee grounds. °Bloody stool (feces). The blood may make your stool bright red, black, or tarry. It may also smell bad. °Shakiness when trying to stop drinking. °Thoughts about hurting yourself or others. °

## 2018-03-15 NOTE — ED Provider Notes (Signed)
  Physical Exam  BP (!) 143/90 (BP Location: Right Arm)   Pulse 75   Temp 97.8 F (36.6 C) (Oral)   Resp 18   Ht 5' 10.5" (1.791 m)   Wt 90.7 kg   SpO2 96%   BMI 28.29 kg/m   Physical Exam  ED Course/Procedures   Clinical Course as of Mar 15 920  Wed Mar 15, 2018  8657 Basic metabolic panel(!) [AH]    Clinical Course User Index [AH] Margarita Mail, PA-C    Procedures  MDM  Patient received from [facility] PA, please see her note for a full HPI. Briefly, patient pending sobering up.  9:21 AM Patient ambulated in a steady gate out of ED, states to nursing staff "im ready to go", "I can't stay". Steady gate with normal vital signs stable for discharge.      Janeece Fitting, PA-C 03/15/18 8469    Shanon Rosser, MD 03/15/18 2237

## 2018-03-15 NOTE — ED Notes (Signed)
Patient ambulated around was able to get up and get dressed

## 2018-03-15 NOTE — ED Notes (Signed)
PT REQUESTING TO LEAVE. PT DECLINES ANY ADDITIONAL VITAL SIGNS. PT READY TO LEAVE. PT NOT AGITATED WITH CARE. PT STATING HE IS UNABLE TO STAY ANY LONGER. PT AWARE HE IS WAITING ON DISCHARGE PAPERS ONLY. PT DECLINES TO WAIT AND IS THANKFUL FOR OUR CARE. EDPA JOHANA AWARE THAT PT HAS LEFT AND CURRENT STATUS

## 2018-03-15 NOTE — ED Triage Notes (Addendum)
Pt is brought in by EMS from a parking lot by [employer] with c/o generalized pain.  Pt reports having a fall 3 days ago.  Per EMS, pt was very uncooperative with EMS---- pt refused to be assessed; also refused vital signs taken.  On arrival to ED, pt refused to go to bed and "just wanted to be in chair".

## 2018-03-16 ENCOUNTER — Emergency Department (HOSPITAL_COMMUNITY)
Admission: EM | Admit: 2018-03-16 | Discharge: 2018-03-16 | Disposition: A | Discharge status: Home / Self Care | Payer: Non-veteran care | Attending: Emergency Medicine | Admitting: Emergency Medicine

## 2018-03-16 DIAGNOSIS — F1910 Other psychoactive substance abuse, uncomplicated: Secondary | ICD-10-CM | POA: Diagnosis not present

## 2018-03-16 DIAGNOSIS — R42 Dizziness and giddiness: Secondary | ICD-10-CM | POA: Diagnosis present

## 2018-03-16 NOTE — ED Notes (Signed)
Pt seen ambulating around the room without assistance with a steady gait.

## 2018-03-16 NOTE — ED Provider Notes (Signed)
[place] DEPT Provider Note   CSN: [id number] Arrival date & time: 03/16/18  0149     History   Chief Complaint No chief complaint on file.   HPI David Young is a 63 y.o. male.  Patient presents to the emergency department with a chief complaint of dizziness.  He states that he has been dizzy, and felt like his balance has been off for the past 2 days.  He was seen yesterday for the same, and had negative head CT. History is limited 2/2 intoxicated state.  The history is provided by the patient. No language interpreter was used.    No past medical history on file.  There are no active problems to display for this patient.   No past surgical history on file.      Home Medications    Prior to Admission medications   Medication Sig Start Date End Date Taking? Authorizing Provider  amoxicillin-clavulanate (AUGMENTIN) 875-125 MG tablet Take 1 tablet by mouth every 12 (twelve) hours. Patient not taking: Reported on 03/15/2018 09/12/17   Hedges, Dellis Filbert, PA-C  chlordiazePOXIDE (LIBRIUM) 25 MG capsule Take 1 capsule (25 mg total) by mouth 3 (three) times daily as needed for withdrawal. Patient not taking: Reported on 03/15/2018 07/05/17   Lajean Saver, MD  clindamycin (CLEOCIN) 150 MG capsule Take 1 capsule (150 mg total) by mouth every 6 (six) hours. Patient not taking: Reported on 05/12/2017 06/20/15   Pattricia Boss, MD  predniSONE (DELTASONE) 10 MG tablet Take 2 tablets (20 mg total) by mouth daily. Patient not taking: Reported on 05/12/2017 06/20/15   Pattricia Boss, MD    Family History No family history on file.  Social History Social History   Tobacco Use  . Smoking status: Current Every Day Smoker    Packs/day: 1.00    Types: Cigarettes  . Smokeless tobacco: Never Used  Substance Use Topics  . Alcohol use: Yes    Comment: 1-2 six packs per day  . Drug use: Yes    Types: Cocaine    Comment: occ cocaine     Allergies   Patient  has no known allergies.   Review of Systems Review of Systems  Unable to perform ROS: Other (Intoxicated)     Physical Exam Updated Vital Signs There were no vitals taken for this visit.  Physical Exam Vitals signs and nursing note reviewed.  Constitutional:      Appearance: He is well-developed.  HENT:     Head: Normocephalic and atraumatic.     Right Ear: External ear normal.     Left Ear: External ear normal.  Eyes:     Conjunctiva/sclera: Conjunctivae normal.     Pupils: Pupils are equal, round, and reactive to light.  Neck:     Musculoskeletal: Normal range of motion and neck supple.     Comments: No pain with neck flexion, no meningismus Cardiovascular:     Rate and Rhythm: Normal rate and regular rhythm.     Heart sounds: Normal heart sounds. No murmur. No friction rub. No gallop.   Pulmonary:     Effort: Pulmonary effort is normal. No respiratory distress.     Breath sounds: Normal breath sounds. No wheezing or rales.  Chest:     Chest wall: No tenderness.  Abdominal:     General: There is no distension.     Palpations: Abdomen is soft. There is no mass.     Tenderness: There is no abdominal tenderness. There is  no guarding or rebound.  Musculoskeletal: Normal range of motion.        General: No tenderness.     Comments: Normal gait.  Skin:    General: Skin is warm and dry.  Neurological:     Mental Status: He is alert and oriented to person, place, and time.     Deep Tendon Reflexes: Reflexes are normal and symmetric.     Comments: CN 3-12 intact, normal finger to nose, no pronator drift, sensation and strength intact bilaterally.  Psychiatric:        Behavior: Behavior normal.        Thought Content: Thought content normal.        Judgment: Judgment normal.      ED Treatments / Results  Labs (all labs ordered are listed, but only abnormal results are displayed) Labs Reviewed - No data to display  EKG None  Radiology Dg Chest 2 View  Result  Date: 03/15/2018 CLINICAL DATA:  Cough. Found in a parking lot. EXAM: CHEST - 2 VIEW COMPARISON:  05/11/2017 FINDINGS: Patient is rotated. Low lung volumes. Unchanged heart size and mediastinal contours. Mild bibasilar atelectasis. No evidence of pulmonary edema, large pleural effusion, or pneumothorax. Degenerative change in the spine. No acute osseous abnormalities are seen. IMPRESSION: Low lung volumes with bibasilar atelectasis. Electronically Signed   By: Keith Rake M.D.   On: 03/15/2018 03:09   Dg Elbow Complete Right  Result Date: 03/15/2018 CLINICAL DATA:  Elbow hematoma. Patient found in a parking lot. EXAM: RIGHT ELBOW - COMPLETE 3+ VIEW COMPARISON:  None. FINDINGS: Evaluation limited by positioning, patient could not cooperate with exam due to medical condition. No visualized acute fracture. There is a large olecranon spur. Limited assessment for joint effusion given positioning. Soft tissue prominence overlying the olecranon spur. Posterior soft tissue edema. No bony destructive change. IMPRESSION: 1. Limited exam due to positioning, patient could not cooperate with positioning. No visualized acute fracture. 2. Large olecranon spur. Posterior soft tissue edema. Electronically Signed   By: Keith Rake M.D.   On: 03/15/2018 03:04   Ct Head Wo Contrast  Result Date: 03/15/2018 CLINICAL DATA:  Initial evaluation for acute altered mental status, found down, recent fall. EXAM: CT HEAD WITHOUT CONTRAST TECHNIQUE: Contiguous axial images were obtained from the base of the skull through the vertex without intravenous contrast. COMPARISON:  None. FINDINGS: Brain: Generalized age-related cerebral atrophy. No acute intracranial hemorrhage. No acute large vessel territory infarct. No mass lesion, midline shift or mass effect. No hydrocephalus. No extra-axial fluid collection. Vascular: No asymmetric hyperdense vessel. Skull: Scalp soft tissues and calvarium within normal limits. Sinuses/Orbits:  Chronic right orbital floor fracture noted. No acute abnormality about the globes and orbital soft tissues. Paranasal sinuses are largely clear. No significant mastoid effusion. Other: None. IMPRESSION: Negative head CT.  No acute intracranial abnormality identified. Electronically Signed   By: Jeannine Boga M.D.   On: 03/15/2018 03:36    Procedures Procedures (including critical care time)  Medications Ordered in ED Medications - No data to display   Initial Impression / Assessment and Plan / ED Course  I have reviewed the triage vital signs and the nursing notes.  Pertinent labs & imaging results that were available during my care of the patient were reviewed by me and considered in my medical decision making (see chart for details).    Patient here and intoxicated.  States that his balance has been off, but he has not ataxia.  He ambulates  without difficulty.  He is neurologically intact.  Had normal CT head yesterday.    Labs from yesterday reviewed.  Discussed with Dr. Randal Buba, who agrees with plan for discharge as there is no ataxia and ambulating normally in ED.  VS-120/76 P-84 RR-18 CBG-103 Final Clinical Impressions(s) / ED Diagnoses   Final diagnoses:  Polysubstance abuse [facility])    ED Discharge Orders    None       Montine Circle, PA-C 03/16/18 [place], April, MD 03/16/18 [phone]

## 2018-03-16 NOTE — ED Triage Notes (Signed)
Per downtime sheet: Pt c/o dizziness and veritgo x2 days   VS-120/76 P-84 RR-18 CBG-103

## 2018-03-16 NOTE — ED Notes (Signed)
*  downtime complete*

## 2018-03-16 NOTE — ED Notes (Signed)
*  all information on downtime forms*

## 2018-03-17 ENCOUNTER — Other Ambulatory Visit: Payer: Self-pay

## 2018-03-17 ENCOUNTER — Emergency Department (HOSPITAL_COMMUNITY)
Admission: EM | Admit: 2018-03-17 | Discharge: 2018-03-17 | Disposition: A | Discharge status: Home / Self Care | Payer: Non-veteran care | Attending: Emergency Medicine | Admitting: Emergency Medicine

## 2018-03-17 ENCOUNTER — Encounter (HOSPITAL_COMMUNITY): Payer: Self-pay | Admitting: Emergency Medicine

## 2018-03-17 DIAGNOSIS — F17210 Nicotine dependence, cigarettes, uncomplicated: Secondary | ICD-10-CM | POA: Diagnosis not present

## 2018-03-17 DIAGNOSIS — D170 Benign lipomatous neoplasm of skin and subcutaneous tissue of head, face and neck: Secondary | ICD-10-CM | POA: Diagnosis not present

## 2018-03-17 DIAGNOSIS — M542 Cervicalgia: Secondary | ICD-10-CM | POA: Diagnosis present

## 2018-03-17 NOTE — ED Triage Notes (Signed)
Patient here for cyst on his neck and complaining of tenderness at the site and palpation.  It is firm to touch.  No trauma, no insect bites.  VSS.

## 2018-03-17 NOTE — ED Provider Notes (Signed)
[place] EMERGENCY DEPARTMENT Provider Note   CSN: [id number] Arrival date & time: 03/17/18  0124     History   Chief Complaint Chief Complaint  Patient presents with  . Neck Pain    HPI David Young is a 63 y.o. male.  63 yo M with a chief complaint of a mobile mass at the base of his neck.  This is been there for quite some time but is somewhat irritating to him now.  Denies trauma.  He is also noted as.  The history is provided by the patient.  Neck Pain  Associated symptoms: no chest pain, no fever and no headaches   Illness  Severity:  Mild Onset quality:  Sudden Duration:  2 days Timing:  Constant Progression:  Worsening Chronicity:  New Associated symptoms: no abdominal pain, no chest pain, no congestion, no diarrhea, no fever, no headaches, no myalgias, no rash, no shortness of breath and no vomiting     History reviewed. No pertinent past medical history.  There are no active problems to display for this patient.   History reviewed. No pertinent surgical history.      Home Medications    Prior to Admission medications   Medication Sig Start Date End Date Taking? Authorizing Provider  amoxicillin-clavulanate (AUGMENTIN) 875-125 MG tablet Take 1 tablet by mouth every 12 (twelve) hours. Patient not taking: Reported on 03/15/2018 09/12/17   Hedges, Dellis Filbert, PA-C  chlordiazePOXIDE (LIBRIUM) 25 MG capsule Take 1 capsule (25 mg total) by mouth 3 (three) times daily as needed for withdrawal. Patient not taking: Reported on 03/15/2018 07/05/17   Lajean Saver, MD  clindamycin (CLEOCIN) 150 MG capsule Take 1 capsule (150 mg total) by mouth every 6 (six) hours. Patient not taking: Reported on 05/12/2017 06/20/15   Pattricia Boss, MD  predniSONE (DELTASONE) 10 MG tablet Take 2 tablets (20 mg total) by mouth daily. Patient not taking: Reported on 05/12/2017 06/20/15   Pattricia Boss, MD    Family History No family history on file.  Social  History Social History   Tobacco Use  . Smoking status: Current Every Day Smoker    Packs/day: 1.00    Types: Cigarettes  . Smokeless tobacco: Never Used  Substance Use Topics  . Alcohol use: Yes    Comment: 1-2 six packs per day  . Drug use: Yes    Types: Cocaine    Comment: occ cocaine     Allergies   Patient has no known allergies.   Review of Systems Review of Systems  Constitutional: Negative for chills and fever.  HENT: Negative for congestion and facial swelling.   Eyes: Negative for discharge and visual disturbance.  Respiratory: Negative for shortness of breath.   Cardiovascular: Negative for chest pain and palpitations.  Gastrointestinal: Negative for abdominal pain, diarrhea and vomiting.  Musculoskeletal: Positive for neck pain. Negative for arthralgias and myalgias.  Skin: Negative for color change and rash.       Mass   Neurological: Negative for tremors, syncope and headaches.  Psychiatric/Behavioral: Negative for confusion and dysphoric mood.     Physical Exam Updated Vital Signs BP (!) 142/90 (BP Location: Right Arm)   Pulse 88   Temp 97.8 F (36.6 C) (Oral)   Resp 18   SpO2 97%   Physical Exam Vitals signs and nursing note reviewed.  Constitutional:      Appearance: He is well-developed.  HENT:     Head: Normocephalic and atraumatic.  Eyes:  Pupils: Pupils are equal, round, and reactive to light.  Neck:     Musculoskeletal: Normal range of motion and neck supple.     Vascular: No JVD.  Cardiovascular:     Rate and Rhythm: Normal rate and regular rhythm.     Heart sounds: No murmur. No friction rub. No gallop.   Pulmonary:     Effort: No respiratory distress.     Breath sounds: No wheezing.  Abdominal:     General: There is no distension.     Tenderness: There is no guarding or rebound.  Musculoskeletal: Normal range of motion.     Comments: Mobile bulge to the left lateral base of his neck.  Nontender.  No erythema or  induration.  Skin:    Coloration: Skin is not pale.     Findings: No rash.  Neurological:     Mental Status: He is alert and oriented to person, place, and time.  Psychiatric:        Behavior: Behavior normal.      ED Treatments / Results  Labs (all labs ordered are listed, but only abnormal results are displayed) Labs Reviewed - No data to display  EKG None  Radiology No results found.  Procedures Procedures (including critical care time)  Medications Ordered in ED Medications - No data to display   Initial Impression / Assessment and Plan / ED Course  I have reviewed the triage vital signs and the nursing notes.  Pertinent labs & imaging results that were available during my care of the patient were reviewed by me and considered in my medical decision making (see chart for details).     63 yo M in clinically with a lipoma.  We will have him follow-up with his PCP.  Discharge home.  4:04 AM:  I have discussed the diagnosis/risks/treatment options with the patient and believe the pt to be eligible for discharge home to follow-up with PCP. We also discussed returning to the ED immediately if new or worsening sx occur. We discussed the sx which are most concerning (e.g., sudden worsening pain, fever, inability to tolerate by mouth) that necessitate immediate return. Medications administered to the patient during their visit and any new prescriptions provided to the patient are listed below.  Medications given during this visit Medications - No data to display    The patient appears reasonably screen and/or stabilized for discharge and I doubt any other medical condition or other [facility] requiring further screening, evaluation, or treatment in the ED at this time prior to discharge.    Final Clinical Impressions(s) / ED Diagnoses   Final diagnoses:  Lipoma of neck    ED Discharge Orders    None       Deno Etienne, DO 03/17/18 [id number]

## 2018-06-27 ENCOUNTER — Encounter: Payer: Self-pay | Admitting: *Deleted

## 2018-06-27 NOTE — Congregational Nurse Program (Signed)
COVID 19 [place] SCREEN No referral needed.

## 2018-07-04 NOTE — Progress Notes (Signed)
[place] Screening performed. Temperature, PHQ-9, and need for medical care and medications assessed. No additional needs assessed.  Arnold Long RN MSN

## 2018-07-10 NOTE — Progress Notes (Signed)
Closing encounter per request. 

## 2018-07-11 NOTE — Progress Notes (Signed)
[place] Screening performed. Temperature, PHQ-9, and need for medical care and medications assessed. No additional needs assessed at this tim.  Arnold Long RN MSN

## 2018-07-19 NOTE — Progress Notes (Signed)
COVID [place] Screening performed. Temperature, PHQ-9, and need for medical care and medications assessed. No additional needs assessed at this time.  Muhammadali Ries RN MSN 

## 2018-09-29 ENCOUNTER — Other Ambulatory Visit: Payer: Self-pay

## 2018-09-29 DIAGNOSIS — Z20822 Contact with and (suspected) exposure to covid-19: Secondary | ICD-10-CM

## 2018-10-02 LAB — NOVEL CORONAVIRUS, NAA: SARS-CoV-2, NAA: NOT DETECTED

## 2019-02-23 IMAGING — CT CT HEAD W/O CM
3 of 4 series · 16 of 47 positions shown, 19 images · non-contrast
Comparison: None.

CLINICAL DATA: Initial evaluation for acute altered mental status,
found down, recent fall.

EXAM:
CT HEAD WITHOUT CONTRAST
TECHNIQUE: Contiguous axial images were obtained from the base of the skull
through the vertex without intravenous contrast.

[Series 2: head wo · axial · 0.47mm/px · z∈[-239,-109]mm · 10 of 32 slices shown, 13 images]
[im 3/32  brain]
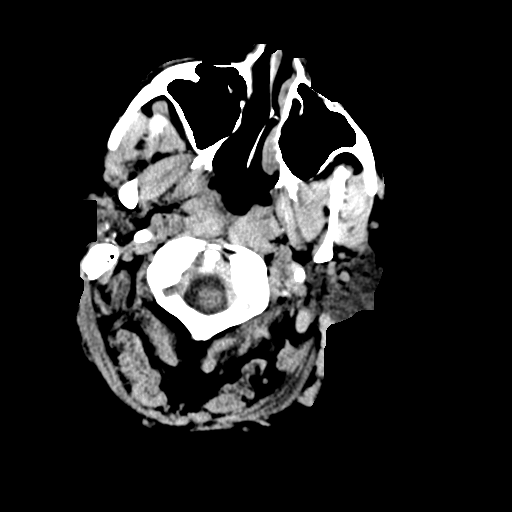
[im 3/32  bone]
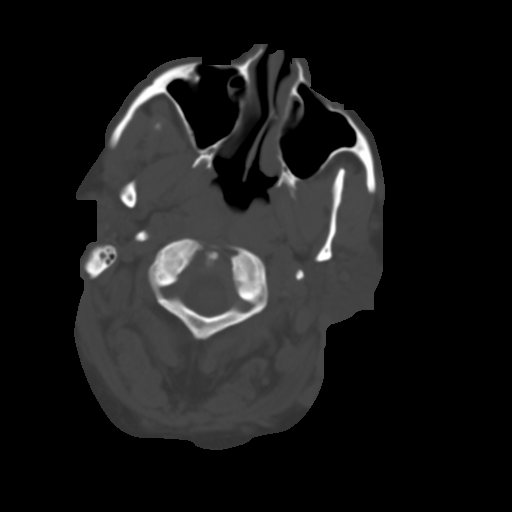
[im 5/32  brain]
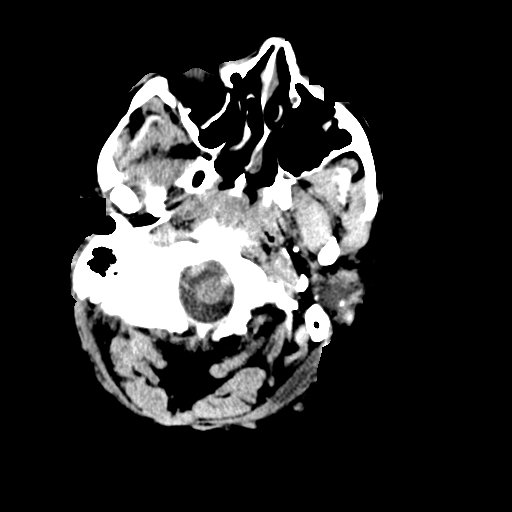
[im 9/32  brain]
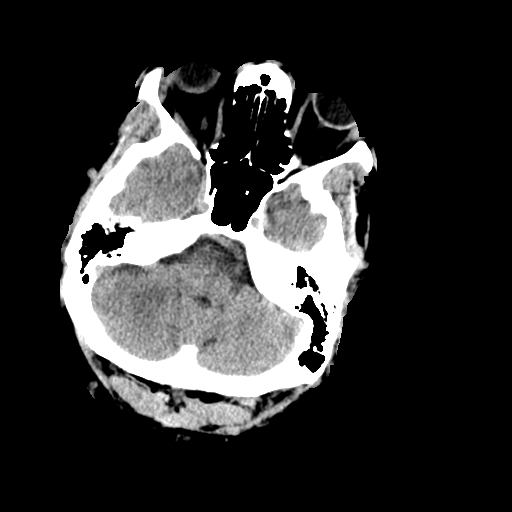
[im 11/32  brain]
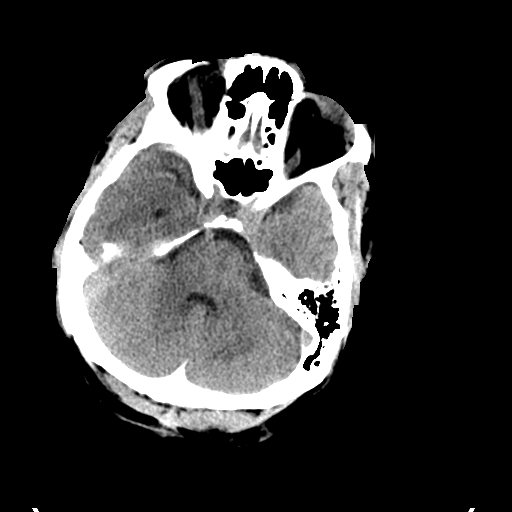
[im 15/32  brain]
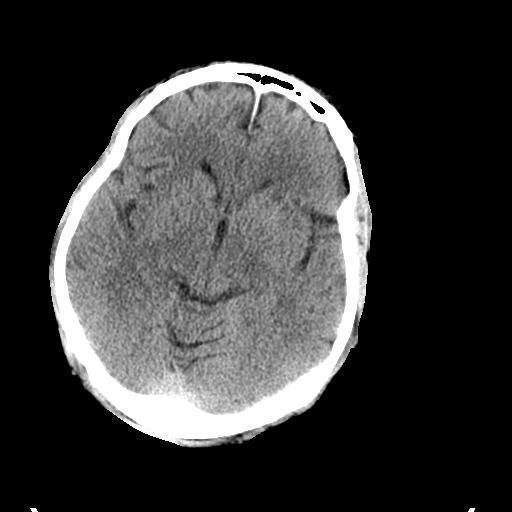
[im 15/32  bone]
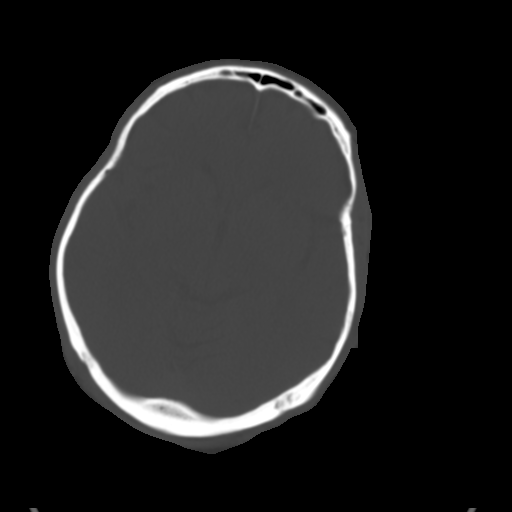
[im 17/32  brain]
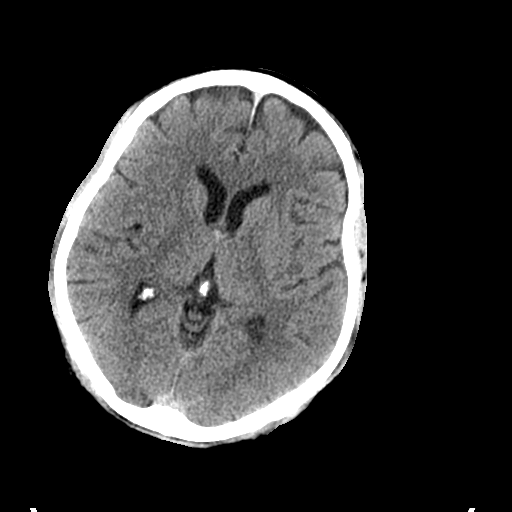
[im 21/32  brain]
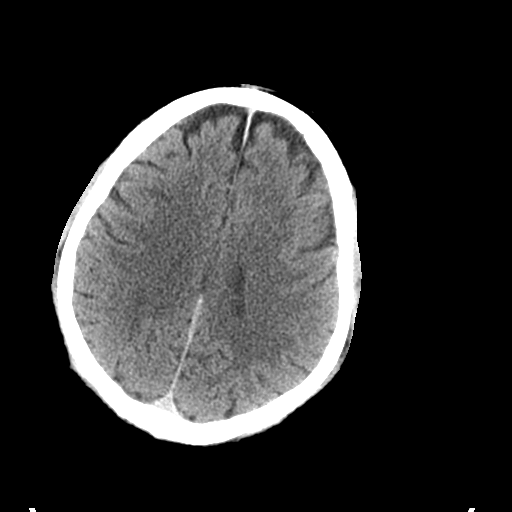
[im 23/32  brain]
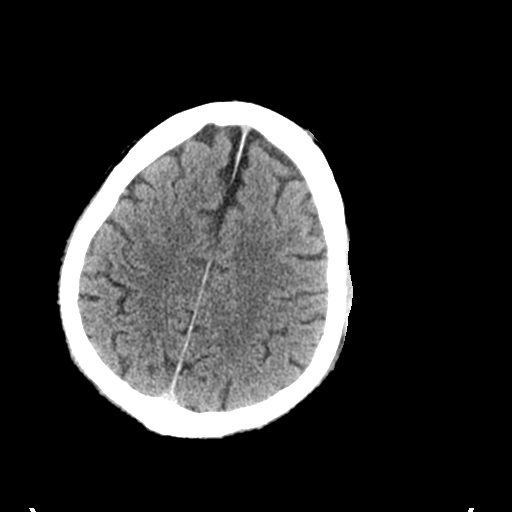
[im 27/32  brain]
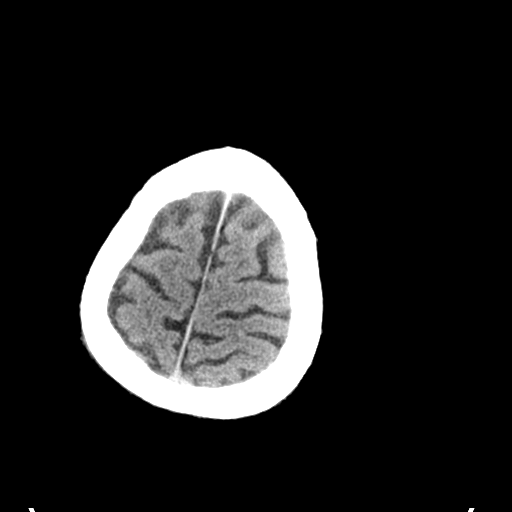
[im 27/32  bone]
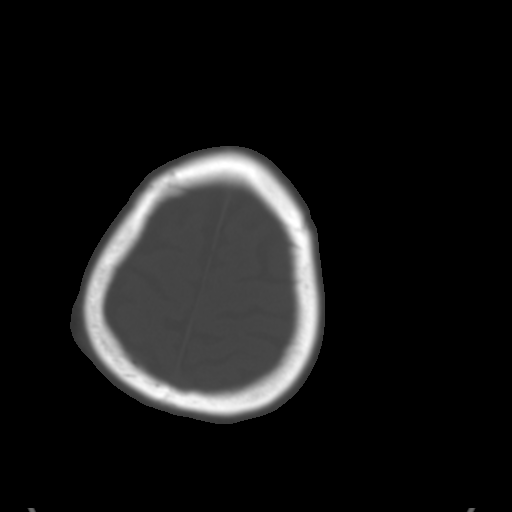
[im 29/32  brain]
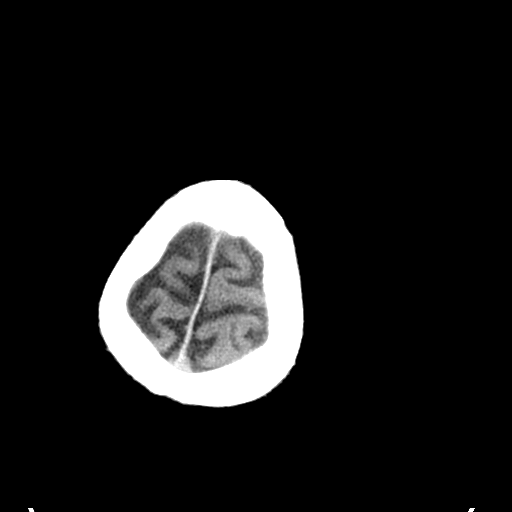

[Series 6: coronal soft tissue · coronal · 0.29mm/px · 3 of 63 slices shown]
[im 21/63  brain]
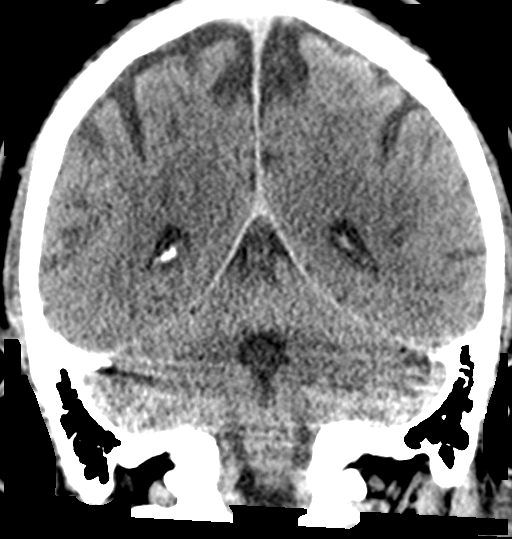
[im 28/63  brain]
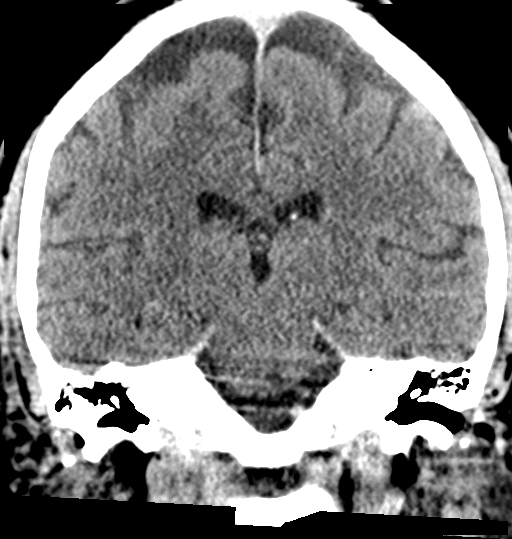
[im 35/63  brain]
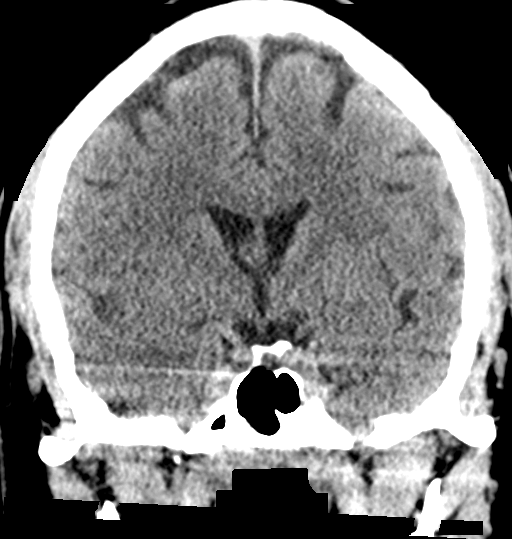

[Series 7: sagittal soft tissue · sagittal · 0.31mm/px · 3 of 52 slices shown]
[im 18/52  brain]
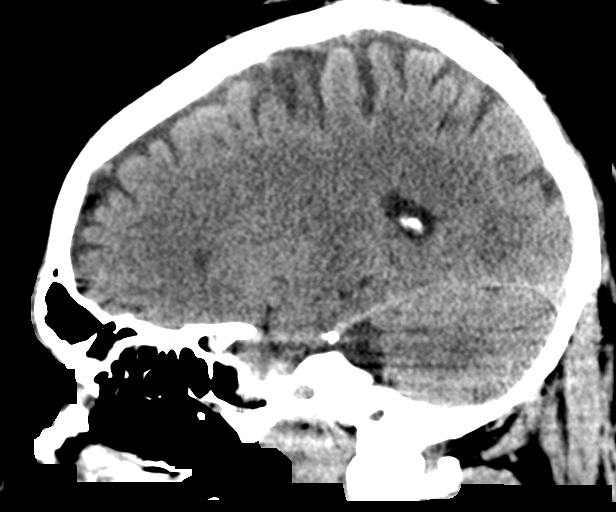
[im 26/52  brain]
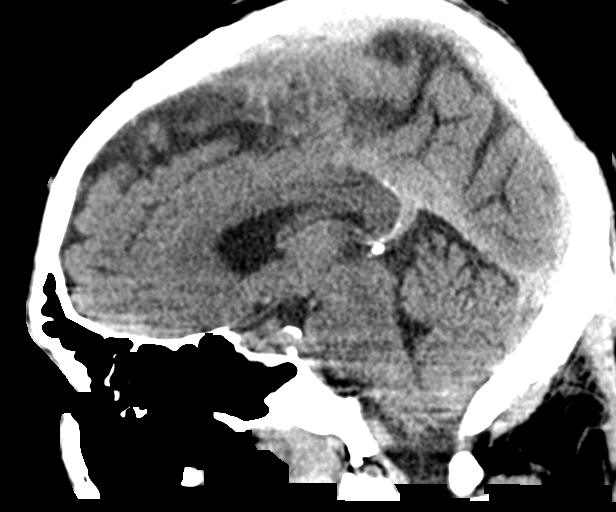
[im 35/52  brain]
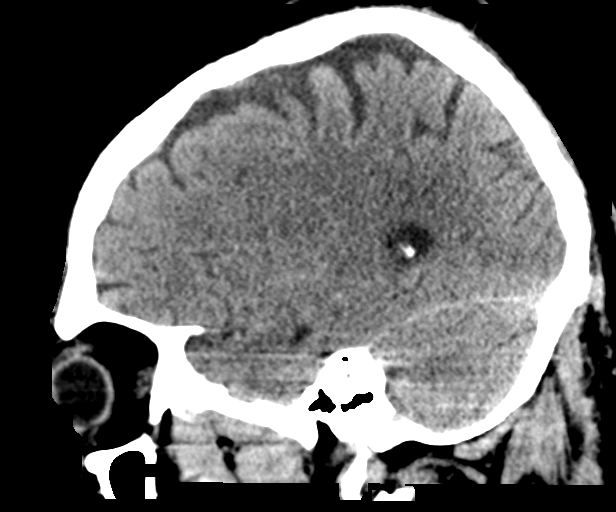

[16 of 47 positions shown; findings below may reference images not displayed]

FINDINGS: Brain: Generalized age-related cerebral atrophy. No acute
intracranial hemorrhage. No acute large vessel territory infarct. No
mass lesion, midline shift or mass effect. No hydrocephalus. No
extra-axial fluid collection.

Vascular: No asymmetric hyperdense vessel.

Skull: Scalp soft tissues and calvarium within normal limits.

Sinuses/Orbits: Chronic right orbital floor fracture noted. No acute
abnormality about the globes and orbital soft tissues. Paranasal
sinuses are largely clear. No significant mastoid effusion.

Other: None.
IMPRESSION: Negative head CT.  No acute intracranial abnormality identified.

## 2021-03-11 ENCOUNTER — Emergency Department (HOSPITAL_COMMUNITY)
Admission: EM | Admit: 2021-03-11 | Discharge: 2021-03-11 | Discharge status: Against Medical Advice | Payer: No Typology Code available for payment source | Attending: Physician Assistant | Admitting: Physician Assistant

## 2021-03-11 ENCOUNTER — Other Ambulatory Visit: Payer: Self-pay

## 2021-03-11 ENCOUNTER — Encounter (HOSPITAL_COMMUNITY): Payer: Self-pay | Admitting: Emergency Medicine

## 2021-03-11 DIAGNOSIS — Z5321 Procedure and treatment not carried out due to patient leaving prior to being seen by health care provider: Secondary | ICD-10-CM | POA: Diagnosis not present

## 2021-03-11 DIAGNOSIS — M79605 Pain in left leg: Secondary | ICD-10-CM | POA: Diagnosis not present

## 2021-03-11 LAB — BASIC METABOLIC PANEL
Anion gap: 11 (ref 5–15)
BUN: <5 mg/dL — ABNORMAL LOW (ref 8–23)
CO2: 26 mmol/L (ref 22–32)
Calcium: 8.9 mg/dL (ref 8.9–10.3)
Chloride: 97 mmol/L — ABNORMAL LOW (ref 98–111)
Creatinine, Ser: 0.91 mg/dL (ref 0.61–1.24)
GFR, Estimated: >60 mL/min (ref >60)
Glucose, Bld: 114 mg/dL — ABNORMAL HIGH (ref 70–99)
Potassium: 3.9 mmol/L (ref 3.5–5.1)
Sodium: 134 mmol/L — ABNORMAL LOW (ref 135–145)

## 2021-03-11 LAB — CBC WITH DIFFERENTIAL/PLATELET
Abs Immature Granulocytes: 0.02 10*3/uL (ref 0.00–0.07)
Basophils Absolute: 0.1 10*3/uL (ref 0.0–0.1)
Basophils Relative: 1 %
Eosinophils Absolute: 0.1 10*3/uL (ref 0.0–0.5)
Eosinophils Relative: 1 %
HCT: 44.9 % (ref 39.0–52.0)
Hemoglobin: 16 g/dL (ref 13.0–17.0)
Immature Granulocytes: 0 %
Lymphocytes Relative: 44 %
Lymphs Abs: 3.4 10*3/uL (ref 0.7–4.0)
MCH: 35.6 pg — ABNORMAL HIGH (ref 26.0–34.0)
MCHC: 35.6 g/dL (ref 30.0–36.0)
MCV: 100 fL (ref 80.0–100.0)
Monocytes Absolute: 0.9 10*3/uL (ref 0.1–1.0)
Monocytes Relative: 12 %
Neutro Abs: 3.3 10*3/uL (ref 1.7–7.7)
Neutrophils Relative %: 42 %
Platelets: 191 10*3/uL (ref 150–400)
RBC: 4.49 MIL/uL (ref 4.22–5.81)
RDW: 15 % (ref 11.5–15.5)
WBC: 7.8 10*3/uL (ref 4.0–10.5)
nRBC: 0 % (ref 0.0–0.2)

## 2021-03-11 LAB — MAGNESIUM: Magnesium: 2.4 mg/dL (ref 1.7–2.4)

## 2021-03-11 LAB — CK: Total CK: 1090 U/L — ABNORMAL HIGH (ref 49–397)

## 2021-03-11 NOTE — ED Triage Notes (Signed)
Patient arrived with EMS from home reports worsening left leg pain onset last week , denies injury/ambulatory .

## 2021-03-11 NOTE — ED Provider Notes (Signed)
Emergency Medicine Provider Triage Evaluation Note  David Young , a 66 y.o. male  was evaluated in triage.  Pt complains of waxing and waning left leg pain.  He reported to triage this is been going on for a week however he tells me it has been going on for many months and just gotten worse over the past few days.  He denies any fevers or wounds.  He states that it feels like a cramp in his leg.  Review of Systems  Positive:  Negative: See above  Physical Exam  BP (!) 155/105 (BP Location: Left Arm)    Pulse 87    Temp (!) 97.5 F (36.4 C) (Oral)    Resp 20    Ht 5\' 10"  (1.778 m)    Wt 107 kg    SpO2 95%    BMI 33.85 kg/m  Gen:   Awake, no distress   Resp:  Normal effort  MSK:   Moves extremities without difficulty, able to ambulate Other:  2+ left PT pulse.  Sensation intact to light touch to left leg.  Medical Decision Making  Medically screening exam initiated at 4:42 AM.  Appropriate orders placed.  David Young was informed that the remainder of the evaluation will be completed by another provider, this initial triage assessment does not replace that evaluation, and the importance of remaining in the ED until their evaluation is complete.  With waxing and waning left leg pain.  We will check labs for electrolytes, CK given his report of cramp.   Lorin Glass, PA-C 03/11/21 0443    Fatima Blank, MD 03/13/21 [phone]

## 2021-03-11 NOTE — ED Notes (Signed)
Pt called x3 twice for v/s no answer.

## 2021-03-11 NOTE — ED Notes (Signed)
No response to last call

## 2021-05-11 DIAGNOSIS — K2950 Unspecified chronic gastritis without bleeding: Secondary | ICD-10-CM | POA: Diagnosis not present

## 2021-05-14 ENCOUNTER — Other Ambulatory Visit: Payer: Self-pay | Admitting: Registered Nurse

## 2021-05-14 DIAGNOSIS — F1010 Alcohol abuse, uncomplicated: Secondary | ICD-10-CM

## 2021-05-14 DIAGNOSIS — R7989 Other specified abnormal findings of blood chemistry: Secondary | ICD-10-CM

## 2021-06-01 ENCOUNTER — Ambulatory Visit
Admission: RE | Admit: 2021-06-01 | Discharge: 2021-06-01 | Disposition: A | Discharge status: Home / Self Care | Payer: Medicare Other | Source: Ambulatory Visit | Attending: Registered Nurse | Admitting: Registered Nurse

## 2021-06-01 ENCOUNTER — Other Ambulatory Visit: Payer: Self-pay

## 2021-06-01 DIAGNOSIS — R7989 Other specified abnormal findings of blood chemistry: Secondary | ICD-10-CM

## 2021-06-01 DIAGNOSIS — F1010 Alcohol abuse, uncomplicated: Secondary | ICD-10-CM

## 2021-09-09 ENCOUNTER — Other Ambulatory Visit (HOSPITAL_COMMUNITY): Payer: Self-pay | Admitting: Registered Nurse

## 2021-09-09 ENCOUNTER — Other Ambulatory Visit: Payer: Self-pay | Admitting: Registered Nurse

## 2021-09-09 DIAGNOSIS — R109 Unspecified abdominal pain: Secondary | ICD-10-CM

## 2021-09-22 ENCOUNTER — Ambulatory Visit (HOSPITAL_COMMUNITY): Admission: RE | Admit: 2021-09-22 | Payer: Medicare HMO | Source: Ambulatory Visit

## 2021-09-24 ENCOUNTER — Ambulatory Visit (HOSPITAL_COMMUNITY): Payer: Medicare HMO

## 2021-10-05 ENCOUNTER — Ambulatory Visit (HOSPITAL_COMMUNITY): Payer: Medicare HMO

## 2021-10-16 ENCOUNTER — Ambulatory Visit (HOSPITAL_COMMUNITY): Admission: RE | Admit: 2021-10-16 | Payer: Medicaid Other | Source: Ambulatory Visit

## 2021-10-16 ENCOUNTER — Encounter (HOSPITAL_COMMUNITY): Payer: Self-pay

## 2021-12-21 ENCOUNTER — Other Ambulatory Visit (HOSPITAL_COMMUNITY): Payer: Non-veteran care

## 2022-01-03 ENCOUNTER — Emergency Department (HOSPITAL_BASED_OUTPATIENT_CLINIC_OR_DEPARTMENT_OTHER): Payer: Medicare Other

## 2022-01-03 ENCOUNTER — Emergency Department (HOSPITAL_COMMUNITY)
Admission: EM | Admit: 2022-01-03 | Discharge: 2022-01-03 | Discharge status: Against Medical Advice | Payer: Medicare Other | Attending: Emergency Medicine | Admitting: Emergency Medicine

## 2022-01-03 ENCOUNTER — Other Ambulatory Visit: Payer: Self-pay

## 2022-01-03 ENCOUNTER — Encounter (HOSPITAL_COMMUNITY): Payer: Self-pay

## 2022-01-03 DIAGNOSIS — Z5321 Procedure and treatment not carried out due to patient leaving prior to being seen by health care provider: Secondary | ICD-10-CM | POA: Diagnosis not present

## 2022-01-03 DIAGNOSIS — M79609 Pain in unspecified limb: Secondary | ICD-10-CM

## 2022-01-03 DIAGNOSIS — R609 Edema, unspecified: Secondary | ICD-10-CM | POA: Diagnosis not present

## 2022-01-03 DIAGNOSIS — M79662 Pain in left lower leg: Secondary | ICD-10-CM | POA: Insufficient documentation

## 2022-01-03 NOTE — ED Notes (Signed)
Pt came up to this NT stating "I got a job to go too and I am leaving. Pt requested for armband to be cut off and b/p to be taken off". Pt seen leaving ED.

## 2022-01-03 NOTE — Progress Notes (Signed)
VASCULAR LAB    Left lower extremity venous duplex has been performed.  See CV proc for preliminary results.  Messaged negative results to Dr. Karle Starch and Roselyn Reef, RN  Mauro Kaufmann, [facility], RVT 01/03/2022, 12:24 PM

## 2022-01-03 NOTE — ED Triage Notes (Signed)
Patient arrived by [facility] from home with left lower pain and swelling to left foot for 1 day. Denies trauma. No sob. Patient states I wonder if I have a clot, no hx of same.

## 2022-05-12 IMAGING — US US ABDOMEN LIMITED
1 series · 14 of 25 positions shown · non-contrast
Comparison: None.

CLINICAL DATA: Elevated LFTs.

EXAM:
ULTRASOUND ABDOMEN LIMITED RIGHT UPPER QUADRANT

[Series 1: us abdomen limited · 0.20mm/px · 14 of 45 slices shown]
[im 1/45]
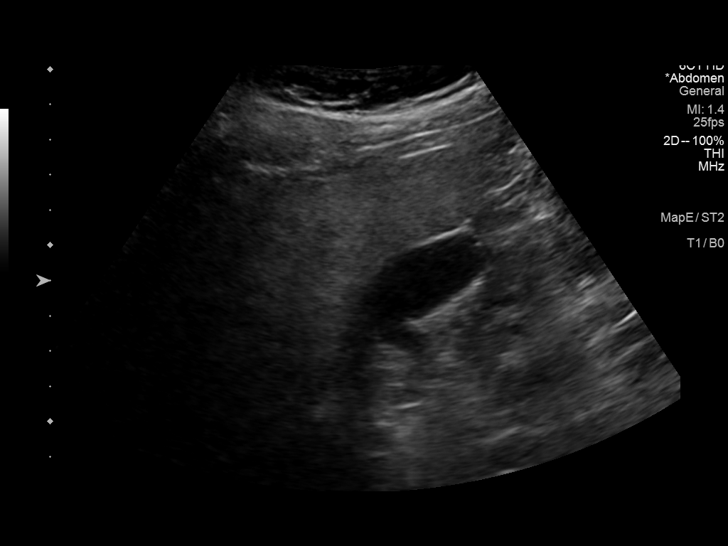
[im 4/45]
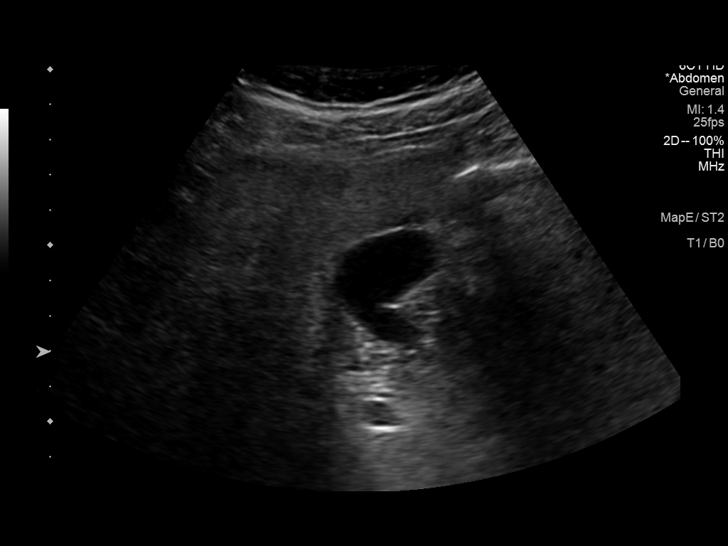
[im 8/45]
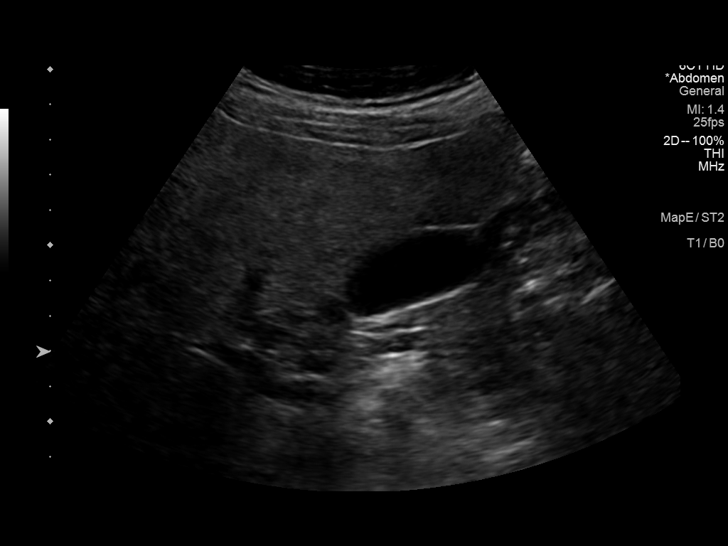
[im 12/45]
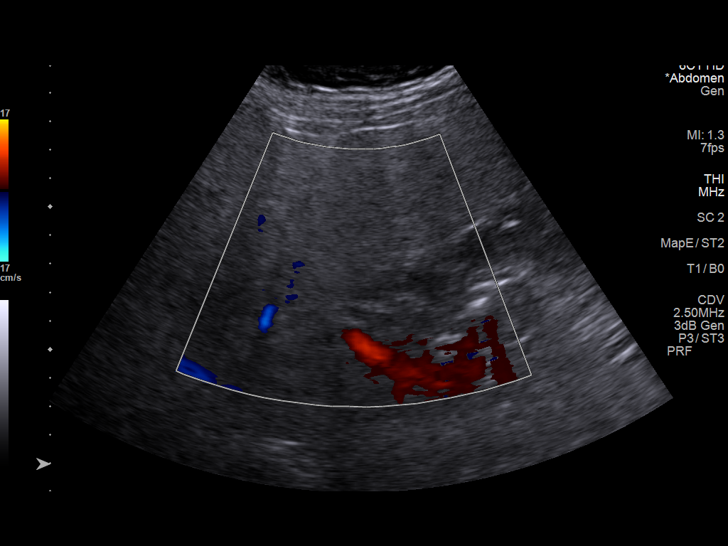
[im 15/45]
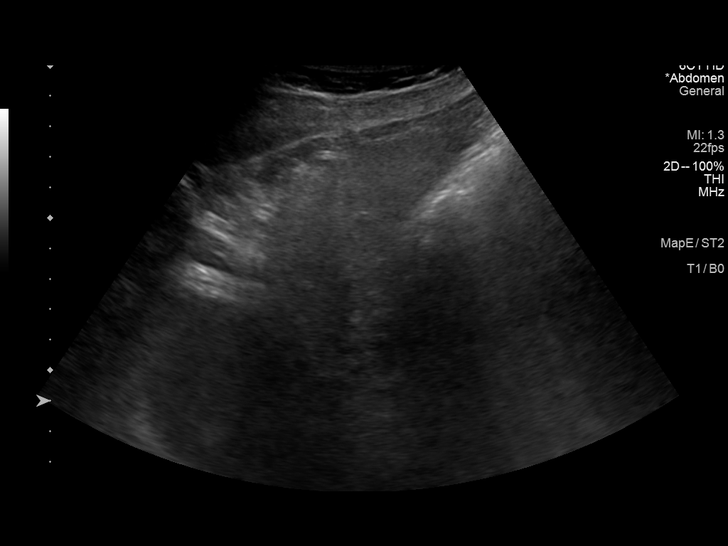
[im 17/45]
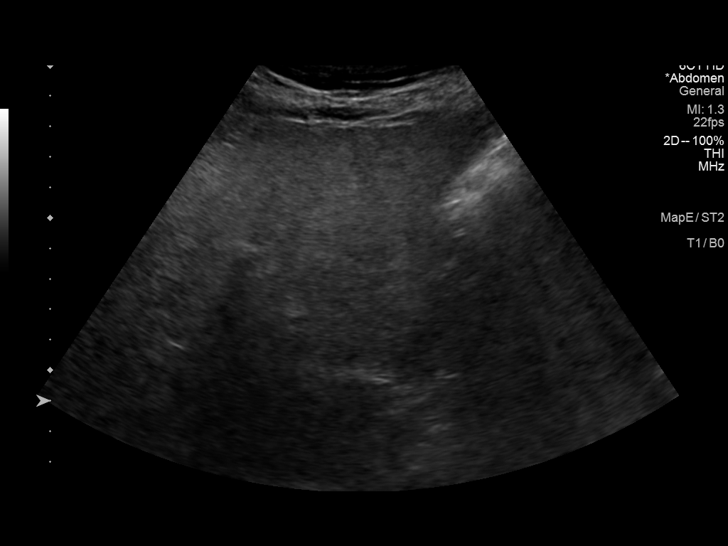
[im 21/45]
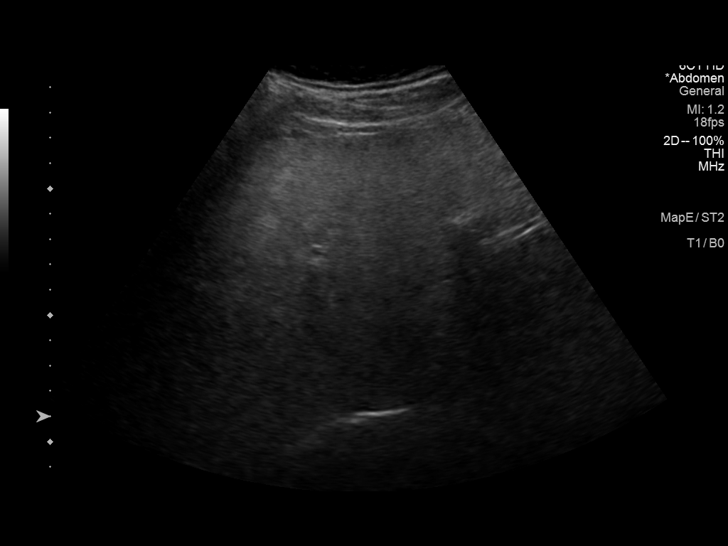
[im 24/45]
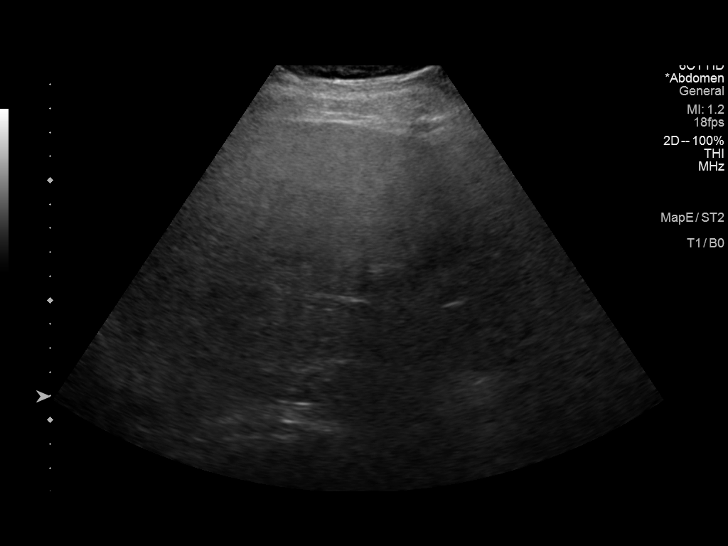
[im 28/45]
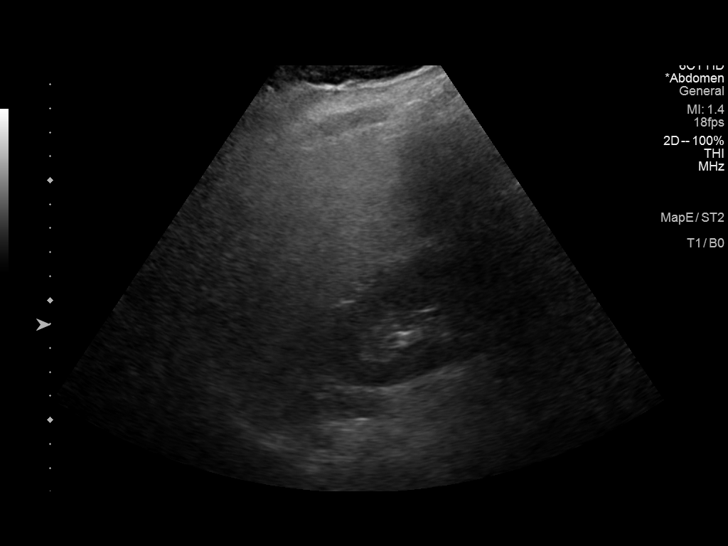
[im 30/45]
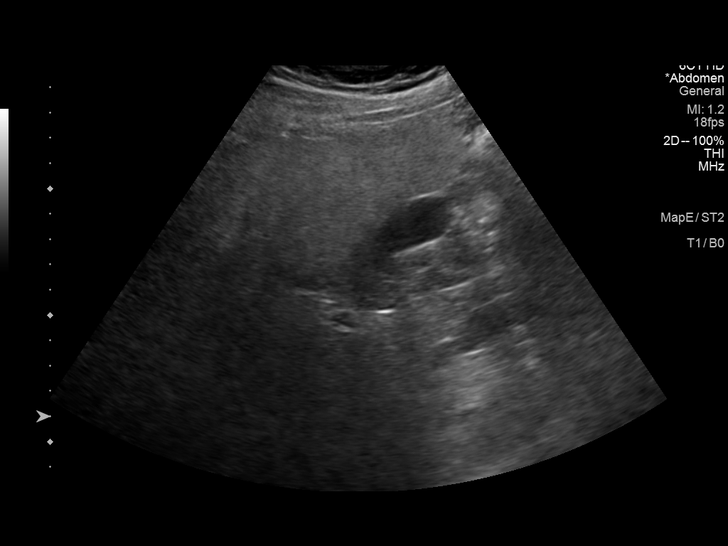
[im 34/45]
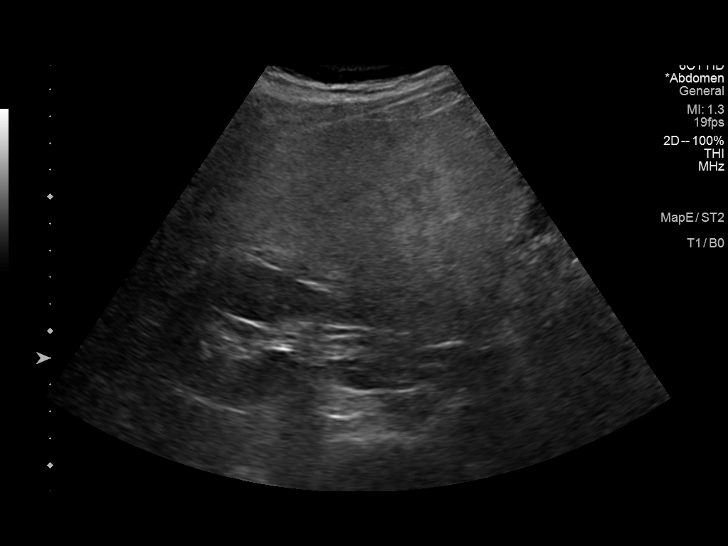
[im 37/45]
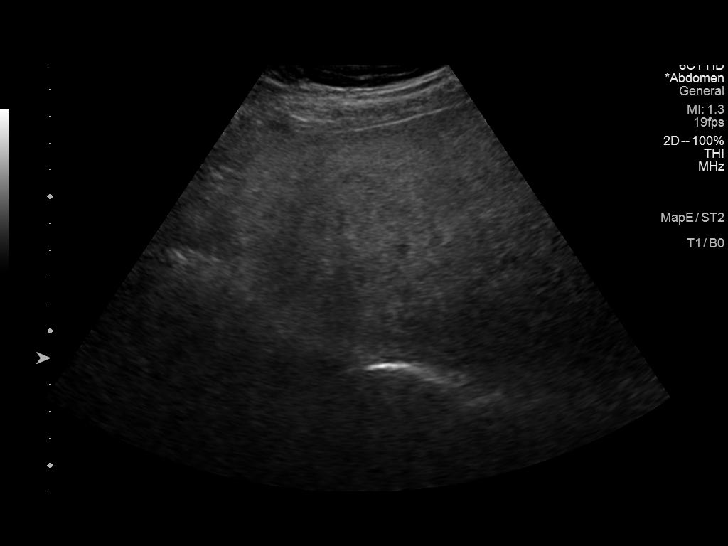
[im 41/45]
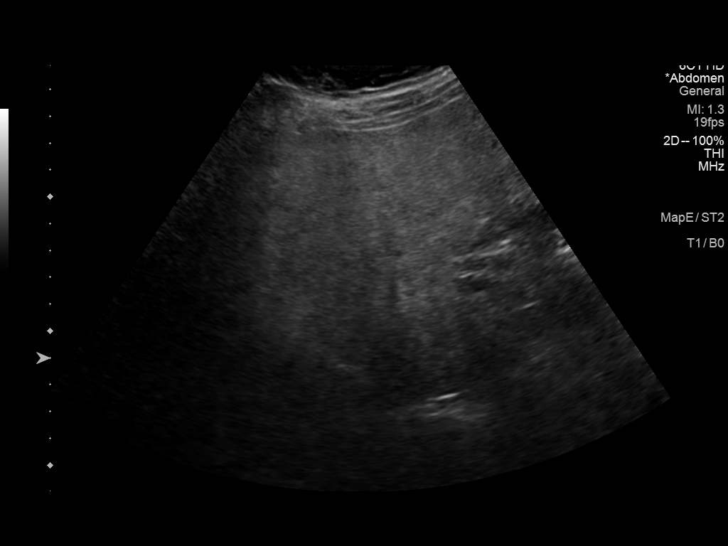
[im 45/45]
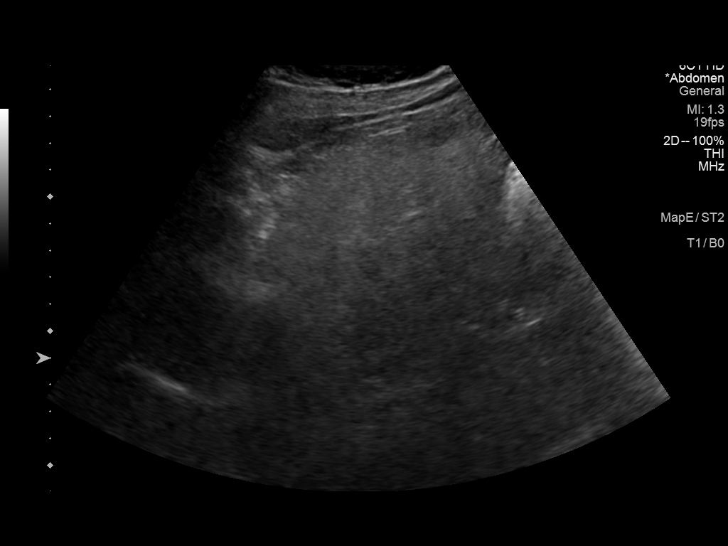

[14 of 25 positions shown; findings below may reference images not displayed]

FINDINGS: Gallbladder:

No gallstones or wall thickening visualized. No sonographic Murphy
sign noted by sonographer.

Common bile duct:

Diameter: 5 mm

Liver:

There is diffuse increased liver echogenicity most commonly seen in
the setting of fatty infiltration. Superimposed inflammation or
fibrosis is not excluded. Clinical correlation is recommended.
Portal vein is patent on color Doppler imaging with normal direction
of blood flow towards the liver.

Other: None.
IMPRESSION: Fatty liver, otherwise unremarkable right upper quadrant ultrasound.

## 2022-07-28 ENCOUNTER — Other Ambulatory Visit: Payer: Self-pay | Admitting: Registered Nurse

## 2022-07-28 DIAGNOSIS — F17210 Nicotine dependence, cigarettes, uncomplicated: Secondary | ICD-10-CM

## 2022-12-30 ENCOUNTER — Other Ambulatory Visit: Payer: Self-pay | Admitting: Registered Nurse

## 2022-12-30 DIAGNOSIS — F17210 Nicotine dependence, cigarettes, uncomplicated: Secondary | ICD-10-CM

## 2023-02-11 ENCOUNTER — Inpatient Hospital Stay: Admission: RE | Admit: 2023-02-11 | Payer: 59 | Source: Ambulatory Visit

## 2023-02-24 ENCOUNTER — Other Ambulatory Visit: Payer: 59

## 2023-03-15 ENCOUNTER — Other Ambulatory Visit: Payer: 59

## 2023-04-12 ENCOUNTER — Inpatient Hospital Stay: Admission: RE | Admit: 2023-04-12 | Payer: 59 | Source: Ambulatory Visit

## 2023-05-03 ENCOUNTER — Other Ambulatory Visit: Payer: 59

## 2023-05-26 ENCOUNTER — Other Ambulatory Visit: Payer: 59

## 2023-07-08 ENCOUNTER — Inpatient Hospital Stay: Admission: RE | Admit: 2023-07-08 | Source: Ambulatory Visit
